# Patient Record
Sex: Female | Born: 1950 | Race: White | Hispanic: No | Marital: Married | Smoking: Former smoker
Health system: Southern US, Community
[De-identification: ages and names within clinical notes are randomized; demographics above are authoritative.]

## PROBLEM LIST (undated history)

## (undated) DIAGNOSIS — F329 Major depressive disorder, single episode, unspecified: Secondary | ICD-10-CM

## (undated) DIAGNOSIS — J189 Pneumonia, unspecified organism: Secondary | ICD-10-CM

## (undated) DIAGNOSIS — E559 Vitamin D deficiency, unspecified: Secondary | ICD-10-CM

## (undated) DIAGNOSIS — E079 Disorder of thyroid, unspecified: Secondary | ICD-10-CM

## (undated) DIAGNOSIS — M1990 Unspecified osteoarthritis, unspecified site: Secondary | ICD-10-CM

## (undated) DIAGNOSIS — S46212A Strain of muscle, fascia and tendon of other parts of biceps, left arm, initial encounter: Secondary | ICD-10-CM

## (undated) DIAGNOSIS — J302 Other seasonal allergic rhinitis: Secondary | ICD-10-CM

## (undated) DIAGNOSIS — R7303 Prediabetes: Secondary | ICD-10-CM

## (undated) DIAGNOSIS — K5790 Diverticulosis of intestine, part unspecified, without perforation or abscess without bleeding: Secondary | ICD-10-CM

## (undated) DIAGNOSIS — E785 Hyperlipidemia, unspecified: Secondary | ICD-10-CM

## (undated) DIAGNOSIS — F32A Depression, unspecified: Secondary | ICD-10-CM

## (undated) DIAGNOSIS — F419 Anxiety disorder, unspecified: Secondary | ICD-10-CM

## (undated) DIAGNOSIS — M810 Age-related osteoporosis without current pathological fracture: Secondary | ICD-10-CM

## (undated) DIAGNOSIS — Z860100 Personal history of colon polyps, unspecified: Secondary | ICD-10-CM

## (undated) DIAGNOSIS — E039 Hypothyroidism, unspecified: Secondary | ICD-10-CM

## (undated) DIAGNOSIS — K219 Gastro-esophageal reflux disease without esophagitis: Secondary | ICD-10-CM

## (undated) DIAGNOSIS — R0600 Dyspnea, unspecified: Secondary | ICD-10-CM

## (undated) DIAGNOSIS — H269 Unspecified cataract: Secondary | ICD-10-CM

## (undated) DIAGNOSIS — E271 Primary adrenocortical insufficiency: Secondary | ICD-10-CM

## (undated) HISTORY — DX: Major depressive disorder, single episode, unspecified: F32.9

## (undated) HISTORY — DX: Unspecified osteoarthritis, unspecified site: M19.90

## (undated) HISTORY — DX: Depression, unspecified: F32.A

## (undated) HISTORY — DX: Gastro-esophageal reflux disease without esophagitis: K21.9

## (undated) HISTORY — PX: UPPER GI ENDOSCOPY: SHX6162

## (undated) HISTORY — DX: Disorder of thyroid, unspecified: E07.9

## (undated) HISTORY — DX: Anxiety disorder, unspecified: F41.9

## (undated) HISTORY — DX: Hyperlipidemia, unspecified: E78.5

## (undated) HISTORY — DX: Age-related osteoporosis without current pathological fracture: M81.0

---
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
## 2007-05-08 HISTORY — PX: OTHER SURGICAL HISTORY: SHX169

## 2012-05-07 HISTORY — PX: COLONOSCOPY: SHX174

## 2014-05-20 ENCOUNTER — Other Ambulatory Visit: Payer: Self-pay | Admitting: Specialist

## 2014-05-20 DIAGNOSIS — M542 Cervicalgia: Secondary | ICD-10-CM

## 2014-05-28 ENCOUNTER — Other Ambulatory Visit: Payer: Self-pay

## 2016-03-07 DIAGNOSIS — E271 Primary adrenocortical insufficiency: Secondary | ICD-10-CM | POA: Insufficient documentation

## 2016-03-07 DIAGNOSIS — E559 Vitamin D deficiency, unspecified: Secondary | ICD-10-CM | POA: Insufficient documentation

## 2016-03-07 DIAGNOSIS — E039 Hypothyroidism, unspecified: Secondary | ICD-10-CM | POA: Insufficient documentation

## 2017-01-17 DIAGNOSIS — G8929 Other chronic pain: Secondary | ICD-10-CM | POA: Insufficient documentation

## 2017-01-17 DIAGNOSIS — M7061 Trochanteric bursitis, right hip: Secondary | ICD-10-CM | POA: Insufficient documentation

## 2017-01-17 DIAGNOSIS — M25562 Pain in left knee: Secondary | ICD-10-CM

## 2017-05-07 DIAGNOSIS — K5790 Diverticulosis of intestine, part unspecified, without perforation or abscess without bleeding: Secondary | ICD-10-CM

## 2017-05-07 HISTORY — DX: Diverticulosis of intestine, part unspecified, without perforation or abscess without bleeding: K57.90

## 2017-07-10 ENCOUNTER — Encounter: Payer: Self-pay | Admitting: Gastroenterology

## 2017-07-11 ENCOUNTER — Encounter: Payer: Self-pay | Admitting: Gastroenterology

## 2017-08-20 ENCOUNTER — Ambulatory Visit (INDEPENDENT_AMBULATORY_CARE_PROVIDER_SITE_OTHER): Payer: Medicare Other | Admitting: Gastroenterology

## 2017-08-20 ENCOUNTER — Encounter: Payer: Self-pay | Admitting: Gastroenterology

## 2017-08-20 VITALS — BP 120/80 | HR 80 | Ht 59.0 in | Wt 137.4 lb

## 2017-08-20 DIAGNOSIS — D369 Benign neoplasm, unspecified site: Secondary | ICD-10-CM | POA: Diagnosis not present

## 2017-08-20 DIAGNOSIS — K219 Gastro-esophageal reflux disease without esophagitis: Secondary | ICD-10-CM | POA: Diagnosis not present

## 2017-08-20 DIAGNOSIS — R1310 Dysphagia, unspecified: Secondary | ICD-10-CM

## 2017-08-20 MED ORDER — SOD PICOSULFATE-MAG OX-CIT ACD 10-3.5-12 MG-GM -GM/160ML PO SOLN: 1.0000 | Freq: Once | ORAL | 0 refills | Status: AC

## 2017-08-20 NOTE — Patient Instructions (Signed)
If you are age 66 or older, your body mass index should be between 23-30. Your Body mass index is 27.75 kg/m. If this is out of the aforementioned range listed, please consider follow up with your Primary Care Provider.  If you are age 52 or younger, your body mass index should be between 19-25. Your Body mass index is 27.75 kg/m. If this is out of the aformentioned range listed, please consider follow up with your Primary Care Provider.   You have been scheduled for a colonoscopy. Please follow written instructions given to you at your visit today.  Please pick up your prep supplies at the pharmacy within the next 1-3 days. If you use inhalers (even only as needed), please bring them with you on the day of your procedure. Your physician has requested that you go to www.startemmi.com and enter the access code given to you at your visit today. This web site gives a general overview about your procedure. However, you should still follow specific instructions given to you by our office regarding your preparation for the procedure.  You have been scheduled for an endoscopy. Please follow written instructions given to you at your visit today. If you use inhalers (even only as needed), please bring them with you on the day of your procedure. Your physician has requested that you go to www.startemmi.com and enter the access code given to you at your visit today. This web site gives a general overview about your procedure. However, you should still follow specific instructions given to you by our office regarding your preparation for the procedure.  Take baby aspirin one by mouth daily.   Thank you,  Dr. Jackquline Denmark

## 2017-08-20 NOTE — Progress Notes (Signed)
Chief Complaint: Dysphagia and history of polyps  Referring Provider: Lucita Lora       ASSESSMENT AND PLAN;  1. Esophageal Dysphagia - Differential diagnoses includes esophageal stricture, Schatzki's ring, motility disorder, eosinophilic esophagitis, pill induced esophagitis, rule out esophageal carcinoma or extrinsic lesions.  2. GERD -patient on steroids as well for Addison's. 3. H/O colonic polyps 2014. 4. RLQ chronic pain (likely musculoskeletal)  Plan: - Start omeprazole 20mg  po qd, continue Zantac 150mg  po QHS x 2 weeks, then continue omeprazole. - EGD with possible dilatation and colonoscopy (for FU of tubular adenomas).  I have discussed the risks and benefits.  The risks including risk of perforation requiring laparotomy, bleeding after polypectomy requiring blood transfusions and risks of anesthesia/sedation were discussed.  Rare risks of missing UGI/colorectal neoplasms.  Alternatives were given.  Patient is fully aware and agrees to proceed. All the questions were answered. EGD and Colonoscopy will be scheduled in upcoming days.  Patient is to report immediately if there is any significant weight loss or excessive bleeding until then. Consent forms were given for review. -I have instructed patient that she needs to chew food especially meats and breads well and eat slowly. - CT abdomen/pelvis, if still with abdominal pain.      HPI:   67 year old very pleasant white female with history of anxiety/depression, hypothyroidism, Addison's disease, hypercholesterolemia, arthritis with occasional problems with dysphagia mostly to solids and 2 pills.  She has long-standing history of heartburn and had been on omeprazole.  She had stopped omeprazole several months ago due to potential side effects.  She was switched to Zantac.  She continues to have problems with heartburn occasionally.  Zantac has been doubled.  She has been having occasional dysphagia described in the mid chest,  mostly to solids, no melena or hematochezia, no weight loss.  It occurs only intermittently and is not getting any worse over the last few months.  No nausea, vomiting, regurgitation, odynophagia.  No significant diarrhea or constipation.  There is no melena or hematochezia.  She would have occasional right lower quadrant abdominal pain associated with back pain.  This pain is not related to defecation.  This would get worse if she lifts heavy objects.  PMH: Anxiety/depression. Hypothyroidism Addison's disease Hypercholesterolemia History of pneumonia Gastroesophageal reflux. Arthritis H/O colonic polyps (TA) 01/27/2103  Past Surgical History:  Procedure Laterality Date  . colon polyps  2009  . COLONOSCOPY  2009   Dr. Lyndel Safe   Family History  Problem Relation Age of Onset  . Peptic Ulcer Mother   . Heart attack Father   . Stroke Father   . Peptic Ulcer Father        lost 6 pints of blood  . Colon cancer Maternal Aunt   . 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 Smoking status: Never Smoker  . Smokeless tobacco: Never Used  Substance Use Topics  . Alcohol use: Never    Frequency: Never  . Drug use: Never   Current Outpatient Medications  Medication Sig Dispense Refill  . co-enzyme Q-10 50 MG capsule Take 100 mg by mouth daily.    . DOCOSAHEXAENOIC ACID PO Take 1 g by mouth daily.    . hydrocortisone (CORTEF) 10 MG tablet Take 10 mg by mouth daily.  1  . levothyroxine (SYNTHROID, LEVOTHROID) 88 MCG tablet Take 88 mcg by mouth daily.    Marland Kitchen loratadine (CLARITIN) 10  MG tablet Take 10 mg by mouth as needed.    . Multiple Vitamins-Minerals (CENTRUM WOMEN PO) Take by mouth.    . ranitidine (ZANTAC) 150 MG capsule Take 150 mg by mouth daily.    . rosuvastatin (CRESTOR) 10 MG tablet Take 10 mg by mouth daily.    Marland Kitchen venlafaxine XR (EFFEXOR-XR) 75 MG 24 hr capsule Take 75 mg by mouth daily.     No current  facility-administered medications for this visit.    Not on File  Review of Systems:  Constitutional: Denies fever, chills, diaphoresis, appetite change and fatigue.  HEENT: Denies photophobia, eye pain, redness, hearing loss, ear pain, congestion, sore throat, rhinorrhea, sneezing, mouth sores, neck pain, neck stiffness and tinnitus.   Respiratory: Denies SOB, DOE, cough, chest tightness,  and wheezing.   Cardiovascular: Denies chest pain, palpitations and leg swelling.  Genitourinary: Denies dysuria, urgency, frequency, hematuria, flank pain and difficulty urinating.  Musculoskeletal: Denies myalgias, back pain, joint swelling, arthralgias and gait problem.  Skin: No rash. .  Neurological: Denies dizziness, seizures, syncope, weakness, light-headedness, numbness and headaches.  Hematological: Denies adenopathy. Easy bruising, personal or family bleeding history  Psychiatric/Behavioral: No anxiety or depression     Physical Exam:    BP 120/80   Pulse 80   Ht 4\' 11"  (1.499 m)   Wt 137 lb 6 oz (62.3 kg)   BMI 27.75 kg/m  Constitutional:  Well-developed, in no acute distress. Psychiatric: Normal mood and affect. Behavior is normal. HEENT: Pupils normal.  Conjunctivae are normal. No scleral icterus. Neck supple.  Cardiovascular: Normal rate, regular rhythm. No edema Pulmonary/chest: Effort normal and breath sounds normal. No wheezing, rales or rhonchi. Abdominal: Soft, nondistended. Nontender. Bowel sounds active throughout. There are no masses palpable. No hepatomegaly. Rectal:    Neurological: Alert and oriented to person place and time. Skin: Skin is warm and dry. No rashes noted.  No flowsheet data found.    Jackquline Denmark, MD   Cc Lucita Lora FNP

## 2017-09-03 ENCOUNTER — Telehealth: Payer: Self-pay

## 2017-09-03 NOTE — Telephone Encounter (Signed)
Phone call from patient. States her appointment has been moved up from 1:30 to 11:00am. When should she start her prep?Marland Kitchen Instructed patient to start prep at 5:00 am instead of 7:00am. Day of procedure.

## 2017-09-04 ENCOUNTER — Other Ambulatory Visit: Payer: Self-pay

## 2017-09-04 ENCOUNTER — Ambulatory Visit (INDEPENDENT_AMBULATORY_CARE_PROVIDER_SITE_OTHER): Payer: Medicare Other | Admitting: Gastroenterology

## 2017-09-04 ENCOUNTER — Encounter: Payer: Self-pay | Admitting: Gastroenterology

## 2017-09-04 VITALS — BP 137/91 | HR 70 | Temp 99.0°F | Resp 12 | Ht 59.0 in | Wt 137.0 lb

## 2017-09-04 DIAGNOSIS — K219 Gastro-esophageal reflux disease without esophagitis: Secondary | ICD-10-CM

## 2017-09-04 DIAGNOSIS — Z86010 Personal history of colonic polyps: Secondary | ICD-10-CM

## 2017-09-04 DIAGNOSIS — D120 Benign neoplasm of cecum: Secondary | ICD-10-CM | POA: Diagnosis not present

## 2017-09-04 DIAGNOSIS — K635 Polyp of colon: Secondary | ICD-10-CM

## 2017-09-04 DIAGNOSIS — K529 Noninfective gastroenteritis and colitis, unspecified: Secondary | ICD-10-CM | POA: Diagnosis not present

## 2017-09-04 DIAGNOSIS — D125 Benign neoplasm of sigmoid colon: Secondary | ICD-10-CM

## 2017-09-04 DIAGNOSIS — R1319 Other dysphagia: Secondary | ICD-10-CM

## 2017-09-04 DIAGNOSIS — R1310 Dysphagia, unspecified: Secondary | ICD-10-CM | POA: Diagnosis not present

## 2017-09-04 DIAGNOSIS — D369 Benign neoplasm, unspecified site: Secondary | ICD-10-CM

## 2017-09-04 MED ORDER — SODIUM CHLORIDE 0.9 % IV SOLN: 500.0000 mL | Freq: Once | INTRAVENOUS | Status: DC

## 2017-09-04 NOTE — Progress Notes (Signed)
A and O x3. Report to RN. Tolerated MAC anesthesia well.

## 2017-09-04 NOTE — Progress Notes (Signed)
Called to room to assist during endoscopic procedure.  Patient ID and intended procedure confirmed with present staff. Received instructions for my participation in the procedure from the performing physician.  

## 2017-09-04 NOTE — Progress Notes (Signed)
Teeth unchanged after procedure. 

## 2017-09-04 NOTE — Patient Instructions (Signed)
YOU HAD AN ENDOSCOPIC PROCEDURE TODAY AT THE [facility]:   Refer to the procedure report that was given to you for any specific questions about what was found during the examination.  If the procedure report does not answer your questions, please call your gastroenterologist to clarify.  If you requested that your care partner not be given the details of your procedure findings, then the procedure report has been included in a sealed envelope for you to review at your convenience later.  YOU SHOULD EXPECT: Some feelings of bloating in the abdomen. Passage of more gas than usual.  Walking can help get rid of the air that was put into your GI tract during the procedure and reduce the bloating. If you had a lower endoscopy (such as a colonoscopy or flexible sigmoidoscopy) you may notice spotting of blood in your stool or on the toilet paper. If you underwent a bowel prep for your procedure, you may not have a normal bowel movement for a few days.  Please Note:  You might notice some irritation and congestion in your nose or some drainage.  This is from the oxygen used during your procedure.  There is no need for concern and it should clear up in a day or so.  SYMPTOMS TO REPORT IMMEDIATELY:   Following lower endoscopy (colonoscopy or flexible sigmoidoscopy):  Excessive amounts of blood in the stool  Significant tenderness or worsening of abdominal pains  Swelling of the abdomen that is new, acute  Fever of 100F or higher  Please see handouts on polyps and diverticulosis.  For urgent or emergent issues, a gastroenterologist can be reached at any hour by calling [phone].   DIET:  We do recommend a small meal at first, but then you may proceed to your regular diet.  Drink plenty of fluids but you should avoid alcoholic beverages for 24 hours.  ACTIVITY:  You should plan to take it easy for the rest of today and you should NOT DRIVE or use heavy machinery until tomorrow (because  of the sedation medicines used during the test).    FOLLOW UP: Our staff will call the number listed on your records the next business day following your procedure to check on you and address any questions or concerns that you may have regarding the information given to you following your procedure. If we do not reach you, we will leave a message.  However, if you are feeling well and you are not experiencing any problems, there is no need to return our call.  We will assume that you have returned to your regular daily activities without incident.  If any biopsies were taken you will be contacted by phone or by letter within the next 1-3 weeks.  Please call us at [phone] if you have not heard about the biopsies in 3 weeks.    SIGNATURES/CONFIDENTIALITY: You and/or your care partner have signed paperwork which will be entered into your electronic medical record.  These signatures attest to the fact that that the information above on your After Visit Summary has been reviewed and is understood.  Full responsibility of the confidentiality of this discharge information lies with you and/or your care-partner.   Thank you for allowing us to provide your healthcare today.  

## 2017-09-04 NOTE — Op Note (Signed)
[place] Patient Name: Krystal Swanson Procedure Date: 09/04/2017 11:02 AM MRN: [id number] Endoscopist: Jackquline Denmark MD, MD Age: 67 Referring MD:  Date of Birth: [birth year] Gender: Female Account #: [id number] Procedure:                Colonoscopy Indications:              High risk colon cancer surveillance: Personal                            history of colonic polyps Medicines:                Monitored Anesthesia Care Procedure:                Pre-Anesthesia Assessment:                           - Prior to the procedure, a History and Physical                            was performed, and patient medications and                            allergies were reviewed. The risks and benefits of                            the procedure and the sedation options and risks                            were discussed with the patient. All questions were                            answered and informed consent was obtained. Patient                            identification and proposed procedure were verified                            by the physician in the procedure room. Mental                            Status Examination: alert and oriented.                            Prophylactic Antibiotics: The patient does not                            require prophylactic antibiotics. Prior                            Anticoagulants: The patient has taken no previous                            anticoagulant or antiplatelet agents. ASA Grade  Assessment: II - A patient with mild systemic                            disease. After reviewing the risks and benefits,                            the patient was deemed in satisfactory condition to                            undergo the procedure. The anesthesia plan was to                            use monitored anesthesia care (MAC). Immediately                            prior to administration of medications, the  patient                            was re-assessed for adequacy to receive sedatives.                            The heart rate, respiratory rate, oxygen                            saturations, blood pressure, adequacy of pulmonary                            ventilation, and response to care were monitored                            throughout the procedure. The physical status of                            the patient was re-assessed after the procedure.                           After obtaining informed consent, the colonoscope                            was passed under direct vision. Throughout the                            procedure, the patient's blood pressure, pulse, and                            oxygen saturations were monitored continuously. The                            Model PCF-H190DL [id number]) scope was introduced                            through the anus and advanced to the the cecum,  identified by appendiceal orifice and ileocecal                            valve. The colonoscopy was performed without                            difficulty. The patient tolerated the procedure                            well. The quality of the bowel preparation was                            adequate to identify polyps 6 mm and larger in size. Scope In: 11:51:33 AM Scope Out: 12:10:29 PM Scope Withdrawal Time: 0 hours 13 minutes 36 seconds  Total Procedure Duration: 0 hours 18 minutes 56 seconds  Findings:                 A 4 mm polyp was found in the cecum. The polyp was                            sessile. The polyp was removed with a cold biopsy                            forceps. Resection and retrieval were complete.                           A 8 mm polyp was found in the sigmoid colon. The                            polyp was sessile. The polyp was removed with a hot                            snare. Resection and retrieval were complete.                            A localized area of mildly erythematous mucosa with                            superficial erosions, measuring approximately 1 cm,                            a fold above the ileocecal valvewas found in the                            proximal ascending colon. Biopsies were taken with                            a cold forceps for histology.                           A few diverticula were found in the sigmoid colon.  A small nonbleeding 6 mm AVM was noted in the                            cecum. Not treated as there was no active bleeding.                           Internal hemorrhoids were found during                            retroflexion. The hemorrhoids were small. Complications:            No immediate complications. Estimated Blood Loss:     Estimated blood loss: none. Impression:               - Colonic polyps status post polypectomy.                           - Erythematous mucosa in the proximal ascending                            colon - likely localized ischemia, doubt                            neoplastic. Biopsied.                           - Diverticulosis in the sigmoid colon.                           - Small nonbleeding AVM in the cecum                           - Internal hemorrhoids. Recommendation:           - Patient has a contact number available for                            emergencies. The signs and symptoms of potential                            delayed complications were discussed with the                            patient. Return to normal activities tomorrow.                            Written discharge instructions were provided to the                            patient.                           - Resume previous diet.                           - Continue present medications.                           -  Await pathology results.                           - Repeat colonoscopy date to be determined after                             pending pathology results are reviewed for                            surveillance based on pathology results. Jackquline Denmark MD, MD 09/04/2017 12:26:23 PM This report has been signed electronically.

## 2017-09-04 NOTE — Op Note (Signed)
[place] Patient Name: Krystal Swanson Procedure Date: 09/04/2017 11:03 AM MRN: [id number] Endoscopist: Jackquline Denmark MD, MD Age: 67 Referring MD:  Date of Birth: 1950/06/20 Gender: Female Account #: [id number] Procedure:                Upper GI endoscopy Indications:              Dysphagia Medicines:                Monitored Anesthesia Care Procedure:                Pre-Anesthesia Assessment:                           - Prior to the procedure, a History and Physical                            was performed, and patient medications and                            allergies were reviewed. The patient is competent.                            The risks and benefits of the procedure and the                            sedation options and risks were discussed with the                            patient. All questions were answered and informed                            consent was obtained. Patient identification and                            proposed procedure were verified by the physician                            in the procedure room. Mental Status Examination:                            alert and oriented. Prophylactic Antibiotics: The                            patient does not require prophylactic antibiotics.                            Prior Anticoagulants: The patient has taken no                            previous anticoagulant or antiplatelet agents. ASA                            Grade Assessment: II - A patient with mild systemic  disease. After reviewing the risks and benefits,                            the patient was deemed in satisfactory condition to                            undergo the procedure. The anesthesia plan was to                            use monitored anesthesia care (MAC). Immediately                            prior to administration of medications, the patient                            was re-assessed for  adequacy to receive sedatives.                            The heart rate, respiratory rate, oxygen                            saturations, blood pressure, adequacy of pulmonary                            ventilation, and response to care were monitored                            throughout the procedure. The physical status of                            the patient was re-assessed after the procedure.                           After obtaining informed consent, the endoscope was                            passed under direct vision. Throughout the                            procedure, the patient's blood pressure, pulse, and                            oxygen saturations were monitored continuously. The                            Model GIF-[id number]) scope was introduced                            through the mouth, and advanced to the second part                            of duodenum. The upper GI endoscopy was  accomplished without difficulty. The patient                            tolerated the procedure well. Scope In: Scope Out: Findings:                 The examined esophagus was normal. Biopsies were                            obtained from the proximal and mid esophagus with                            cold forceps for histology to r/o eosinophilic                            esophagitis.                           Localized mild inflammation characterized by                            erythema was found in the gastric antrum. Biopsies                            were taken with a cold forceps for Helicobacter                            pylori testing using CLOtest.                           The exam was otherwise without abnormality. Since                            there were no obvious lesions in the esophagus, we                            decided to hold off on empiric esophageal                            dilatation. Complications:            No  immediate complications. Estimated Blood Loss:     Estimated blood loss: none. Impression:               - Mild gastritis.                           - The examination was otherwise normal. Recommendation:           - Patient has a contact number available for                            emergencies. The signs and symptoms of potential                            delayed complications were discussed with the  patient. Return to normal activities tomorrow.                            Written discharge instructions were provided to the                            patient.                           - Resume previous diet.                           - Continue present medications. continue omeprazole                            for now.                           - Await pathology results.                           - Return to my office in 12 weeks. Jackquline Denmark MD, MD 09/04/2017 12:16:50 PM This report has been signed electronically.

## 2017-09-05 ENCOUNTER — Telehealth: Payer: Self-pay

## 2017-09-05 NOTE — Telephone Encounter (Signed)
  Follow up Call-  Call back number 09/04/2017  Post procedure Call Back phone  # [phone]  Permission to leave phone message Yes  Some recent data might be hidden     Patient questions:  Do you have a fever, pain , or abdominal swelling? No. Pain Score  0 *  Have you tolerated food without any problems? Yes.    Have you been able to return to your normal activities? Yes.    Do you have any questions about your discharge instructions: Diet   No. Medications  No. Follow up visit  No.  Do you have questions or concerns about your Care? No.  Actions: * If pain score is 4 or above: No action needed, pain <4.  No problems noted per pt. maw

## 2017-09-05 NOTE — Telephone Encounter (Signed)
Per the procedure note, she was scheduled for a 12 week follow-up on 11/29/17 at 11:00 am in [facility].  A letter was mailed to her.

## 2017-09-06 LAB — HELICOBACTER PYLORI SCREEN-BIOPSY: UREASE: NEGATIVE

## 2017-09-09 NOTE — Telephone Encounter (Signed)
[email or web address]

## 2017-09-15 ENCOUNTER — Encounter: Payer: Self-pay | Admitting: Gastroenterology

## 2017-11-29 ENCOUNTER — Ambulatory Visit: Payer: Medicare Other | Admitting: Gastroenterology

## 2017-12-06 ENCOUNTER — Encounter: Payer: Self-pay | Admitting: Gastroenterology

## 2017-12-17 ENCOUNTER — Ambulatory Visit (INDEPENDENT_AMBULATORY_CARE_PROVIDER_SITE_OTHER): Payer: Medicare Other | Admitting: Gastroenterology

## 2017-12-17 ENCOUNTER — Encounter: Payer: Self-pay | Admitting: Gastroenterology

## 2017-12-17 VITALS — BP 138/82 | HR 66 | Ht 59.0 in | Wt 138.8 lb

## 2017-12-17 DIAGNOSIS — Z86010 Personal history of colonic polyps: Secondary | ICD-10-CM

## 2017-12-17 DIAGNOSIS — K219 Gastro-esophageal reflux disease without esophagitis: Secondary | ICD-10-CM | POA: Diagnosis not present

## 2017-12-17 NOTE — Patient Instructions (Signed)
If you are age 66 or older, your body mass index should be between 23-30. Your Body mass index is 28.03 kg/m. If this is out of the aforementioned range listed, please consider follow up with your Primary Care Provider.  If you are age 55 or younger, your body mass index should be between 19-25. Your Body mass index is 28.03 kg/m. If this is out of the aformentioned range listed, please consider follow up with your Primary Care Provider.   Thank you, Dr. Lyndel Safe

## 2017-12-17 NOTE — Progress Notes (Signed)
IMPRESSION and PLAN:    #1. GERD -Decrease omeprazole 20mg  to PO QOD x 4 weeks, then can try stoping. -Zantac 150mg  po qhs to continue.     #2.  History of colonic polyps 09/2017 (Next colonoscopy due in 5 years).      HPI:    Chief Complaint:   Krystal Swanson is a 67 y.o. female  Follow-up from the procedures. She underwent EGD and colonoscopy on 09/2017.  EGD showed mild gastritis.  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 She has done very well. Has been complaining of frequent stool, has to wipe several times.  Mostly since the procedure.  However, she has also has started taking colon-cleanse. She does not want to stop it as she has felt significantly better.  She will call us with the ingredients however.  It is locally made in [place].  I have tried to google it but have not been able to google that particular variety of colon clense.   Past Medical History:  Diagnosis Date  . Anxiety   . Arthritis   . Depression   . GERD (gastroesophageal reflux disease)   . Hyperlipidemia   . Osteoporosis   . Thyroid disease     Current Outpatient Medications  Medication Sig Dispense Refill  . Cholecalciferol (VITAMIN D) 2000 units CAPS Take by mouth.    . co-enzyme Q-10 50 MG capsule Take 100 mg by mouth daily.    . DOCOSAHEXAENOIC ACID PO Take 1 g by mouth daily.    . hydrocortisone (CORTEF) 10 MG tablet Take 10 mg by mouth daily. Take 2 tablets in the AM and 1 1/2 in the afternoon  1  . levothyroxine (SYNTHROID, LEVOTHROID) 88 MCG tablet Take 88 mcg by mouth daily.    Marland Kitchen loratadine (CLARITIN) 10 MG tablet Take 10 mg by mouth as needed.    . Multiple Vitamins-Minerals (CENTRUM WOMEN PO) Take by mouth.    Marland Kitchen omeprazole (PRILOSEC) 20 MG capsule Take 20 mg by mouth daily.    . ranitidine (ZANTAC) 150 MG capsule Take 150 mg by mouth every evening.     . rosuvastatin (CRESTOR) 10 MG tablet Take 10 mg by mouth daily.    Marland Kitchen  venlafaxine XR (EFFEXOR-XR) 75 MG 24 hr capsule Take 75 mg by mouth daily.     Current Facility-Administered Medications  Medication Dose Route Frequency Provider Last Rate Last Dose  . 0.9 %  sodium chloride infusion  500 mL Intravenous Once Jackquline Denmark, MD        Past Surgical History:  Procedure Laterality Date  . colon polyps  2009  . COLONOSCOPY  2014   Colonic polyp. Mild sigmoid diverticulosis.     Family History  Problem Relation Age of Onset  . Peptic Ulcer Mother   . Heart attack Father   . Stroke Father   . Peptic Ulcer Father        lost 6 pints of blood  . Colon cancer Maternal Aunt   . Breast cancer Maternal Aunt     Social History   Tobacco Use  . Smoking status: Never Smoker  . Smokeless tobacco: Never Used  Substance Use Topics  . Alcohol use: Yes    Frequency: Never    Comment: social  . Drug use: Never    No Known Allergies   Review of Systems: All systems reviewed and negative except where noted in HPI.    Physical Exam:  BP 138/82   Pulse 66   Ht 4\' 11"  (1.499 m)   Wt 138 lb 12.8 oz (63 kg)   SpO2 98%   BMI 28.03 kg/m  @WEIGHTLAST3 @ GENERAL:  Alert, oriented, cooperative, not in acute distress. PSYCH: :Pleasant, normal mood and affect. PULM: Normal respiratory effort, lungs CTA bilaterally, no wheezing. ABDOMEN: Inspection: No visible peristalsis, no abnormal pulsations, skin normal.  Palpation/percussion: Soft, nontender, nondistended, no rigidity, no abnormal dullness to percussion, no hepatosplenomegaly and no palpable abdominal masses.  Auscultation: Normal bowel sounds, no abdominal bruits.     Krystal Feuerstein,MD 12/17/2017, 2:34 PM   CC Charlynn Court, NP

## 2018-04-21 ENCOUNTER — Ambulatory Visit: Payer: Self-pay | Admitting: Podiatry

## 2018-05-08 ENCOUNTER — Encounter: Payer: Self-pay | Admitting: Sports Medicine

## 2018-05-08 ENCOUNTER — Ambulatory Visit (INDEPENDENT_AMBULATORY_CARE_PROVIDER_SITE_OTHER): Payer: Medicare Other

## 2018-05-08 ENCOUNTER — Ambulatory Visit: Payer: Medicare Other | Admitting: Sports Medicine

## 2018-05-08 VITALS — BP 130/80 | HR 85 | Resp 16 | Ht 59.0 in | Wt 136.0 lb

## 2018-05-08 DIAGNOSIS — M79672 Pain in left foot: Secondary | ICD-10-CM

## 2018-05-08 DIAGNOSIS — M779 Enthesopathy, unspecified: Secondary | ICD-10-CM | POA: Diagnosis not present

## 2018-05-08 DIAGNOSIS — M2040 Other hammer toe(s) (acquired), unspecified foot: Secondary | ICD-10-CM | POA: Diagnosis not present

## 2018-05-08 DIAGNOSIS — M2042 Other hammer toe(s) (acquired), left foot: Secondary | ICD-10-CM

## 2018-05-08 DIAGNOSIS — D3610 Benign neoplasm of peripheral nerves and autonomic nervous system, unspecified: Secondary | ICD-10-CM | POA: Diagnosis not present

## 2018-05-08 DIAGNOSIS — M7752 Other enthesopathy of left foot: Secondary | ICD-10-CM | POA: Diagnosis not present

## 2018-05-08 NOTE — Progress Notes (Addendum)
Subjective: Krystal Swanson is a 68 y.o. female patient who presents to office for evaluation of left foot pain. Patient complains of progressive pain especially over the last 8 months at the ball of her left foot it feels like something is in there. Ranks pain 3/10 and is now interferring with daily activities. Patient has tried Aspercreme with no relief in symptoms. Patient denies any other pedal complaints. Denies injury/trip/fall/sprain/any causative factors.  Patient admits to multiple joint problems including low back right hip and left knee.  Patient is to see orthopedic doctor today.  Review of Systems  Musculoskeletal: Positive for joint pain.  All other systems reviewed and are negative.   Patient Active Problem List   Diagnosis Date Noted  . Chronic pain of left knee 01/17/2017  . Trochanteric bursitis of right hip 01/17/2017  . 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 Hypothyroid 03/07/2016  . Vitamin D deficiency 03/07/2016    Current Outpatient Medications on File Prior to Visit  Medication Sig Dispense Refill  . Cholecalciferol (VITAMIN D) 2000 units CAPS Take by mouth.    . DOCOSAHEXAENOIC ACID PO Take 1 g by mouth daily.    . hydrocortisone (CORTEF) 10 MG tablet Take 10 mg by mouth daily. Take 2 tablets in the AM and 1 1/2 in the afternoon  1  . levothyroxine (SYNTHROID, LEVOTHROID) 88 MCG tablet Take 88 mcg by mouth daily.    Marland Kitchen loratadine (CLARITIN) 10 MG tablet Take 10 mg by mouth as needed.    . Multiple Vitamins-Minerals (CENTRUM WOMEN PO) Take by mouth.    Marland Kitchen omeprazole (PRILOSEC) 20 MG capsule Take 20 mg by mouth daily.    . rosuvastatin (CRESTOR) 10 MG tablet Take 10 mg by mouth daily.    Marland Kitchen venlafaxine XR (EFFEXOR-XR) 75 MG 24 hr capsule Take 75 mg by mouth daily.     Current Facility-Administered Medications on File Prior to Visit  Medication Dose Route Frequency Provider Last Rate Last Dose  . 0.9 %  sodium chloride infusion  500 mL Intravenous Once Jackquline Denmark, MD        No Known Allergies  Objective:  General: Alert and oriented x3 in no acute distress  Dermatology: No open lesions bilateral lower extremities, no webspace macerations, no ecchymosis bilateral, all nails x 10 are well manicured.  Vascular: Dorsalis Pedis and Posterior Tibial pedal pulses palpable, Capillary Fill Time 3 seconds,(+) pedal hair growth bilateral, no edema bilateral lower extremities, Temperature gradient within normal limits.  Neurology: Gross sensation intact via light touch bilateral. (- )Tinels sign bilateral.  Subjective numbness to toes left greater than right.  Musculoskeletal: Mild tenderness with palpation at ball of left foot especially underneath the second metatarsal head,No pain with calf compression bilateral. There is decreased ankle rom with knee extending  vs flexed resembling gastroc equnius bilateral, Subtalar joint range of motion is within normal limits, there is no 1st ray hypermobility noted bilateral, decreased 1st MPJ rom Right>Left with functional limitus noted on weightbearing exam and second hammertoe deformity with medial deviation on left. Strength within normal limits in all groups bilateral.   Gait: Antalgic gait  Xrays  Left Foot   Impression: Normal osseous mineralization, there is mild hammertoe contracture with the most contracture noted at the second toe, decreased intermetatarsal space at the second and third metatarsals, inferior calcaneal spur, no other acute findings.  Assessment and Plan: Problem List Items Addressed This Visit    None    Visit Diagnoses  Left foot pain    -  Primary   Relevant Orders   DG Foot Complete Left   Capsulitis       Hammer toe, unspecified laterality       Neuroma           -Complete examination performed -Xrays reviewed -Discussed treatement options for likely capsulitis with hammertoe versus neuroma on left -Patient declines steroid injection at this time -Dispensed  metatarsal padding sleeve -Advised patient to follow-up with orthopedic doctor and if symptoms continue to progress return to office after 1 month for injection treatment -Patient to return to office as needed or sooner if condition worsens.  Landis Martins, DPM

## 2018-05-22 ENCOUNTER — Other Ambulatory Visit: Payer: Self-pay | Admitting: Sports Medicine

## 2018-05-22 DIAGNOSIS — M2040 Other hammer toe(s) (acquired), unspecified foot: Secondary | ICD-10-CM

## 2018-05-22 DIAGNOSIS — M79672 Pain in left foot: Secondary | ICD-10-CM

## 2018-05-22 DIAGNOSIS — M779 Enthesopathy, unspecified: Secondary | ICD-10-CM

## 2018-07-21 ENCOUNTER — Encounter (HOSPITAL_COMMUNITY): Payer: Self-pay | Admitting: Student

## 2018-07-21 NOTE — H&P (Signed)
TOTAL HIP ADMISSION H&P  Patient is admitted for right total hip arthroplasty.  Subjective:  Chief Complaint: right hip pain  HPI: Krystal Swanson, 68 y.o. female, has a history of pain and functional disability in the right hip(s) due to arthritis and patient has failed non-surgical conservative treatments for greater than 12 weeks to include activity modification.  Onset of symptoms was gradual starting 2 years ago with gradually worsening course since that time.The patient noted no past surgery on the right hip(s).  Patient currently rates pain in the right hip at 10 out of 10 with activity. Patient has worsening of pain with activity and weight bearing and pain that interfers with activities of daily living. Patient has evidence of bone-on-bone arthritis in the right hip with subchondral cystic formation and osteophyte formation. I feel that it is osteoarthritis and not avascular necrosis by imaging studies.There is no current active infection.  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 Mild sigmoid diverticulosis.     Current Facility-Administered Medications  Medication Dose Route Frequency Provider Last Rate Last Dose   0.9 %  sodium chloride infusion  500 mL Intravenous Once Jackquline Denmark, MD       Current Outpatient Medications  Medication Sig Dispense Refill Last Dose   Cholecalciferol (VITAMIN D) 2000 units CAPS Take by mouth.   Taking   DOCOSAHEXAENOIC ACID PO Take 1 g by  mouth daily.   Taking   hydrocortisone (CORTEF) 10 MG tablet Take 10 mg by mouth daily. Take 2 tablets in the AM and 1 1/2 in the afternoon  1 Taking   levothyroxine (SYNTHROID, LEVOTHROID) 88 MCG tablet Take 88 mcg by mouth daily.   Taking   loratadine (CLARITIN) 10 MG tablet Take 10 mg by mouth as needed.   Taking   Multiple Vitamins-Minerals (CENTRUM WOMEN PO) Take by mouth.   Taking   omeprazole (PRILOSEC) 20 MG capsule Take 20 mg by mouth daily.   Taking   rosuvastatin (CRESTOR) 10 MG tablet Take 10 mg by mouth daily.   Taking   venlafaxine XR (EFFEXOR-XR) 75 MG 24 hr capsule Take 75 mg by mouth daily.   Taking   No Known Allergies  Social History   Tobacco Use   Smoking status: Never Smoker   Smokeless tobacco: Never Used  Substance Use Topics   Alcohol use: Yes    Frequency: Never    Comment: social    Family History  Problem Relation Age of Onset   Peptic Ulcer Mother    Heart attack Father    Stroke Father    Peptic Ulcer Father        lost 6 pints of blood   Colon cancer Maternal Aunt    Breast cancer Maternal Aunt      ROS Constitutional: Constitutional: no fever, chills, night sweats, or significant weight loss.  Cardiovascular: Cardiovascular: no palpitations or chest pain.  Respiratory: Respiratory: no cough  or shortness of breath and No COPD.  Gastrointestinal: Gastrointestinal: no vomiting or nausea.  Musculoskeletal: Musculoskeletal: no swelling in Joints and Joint Pain.  Neurologic: Neurologic: no numbness, tingling, or difficulty with balance. Objective:  Physical Exam Patient is a 68 year old female. Well nourished and well developed. General: Alert and oriented x3, cooperative and pleasant, no acute distress. Head: normocephalic, atraumatic, neck supple. Eyes: EOMI. Respiratory: breath sounds clear in all fields, no wheezing, rales, or rhonchi. Cardiovascular: Regular rate and rhythm, no murmurs, gallops or rubs. Abdomen:  non-tender to palpation and soft, normoactive bowel sounds.  Musculoskeletal: Left Hip Exam: ROM: normal without discomfort. There is no tenderness over the greater trochanter bursa. There is no pain on provocative testing of the hip.  Right Hip Exam: ROM: Flexion to 110, No Internal Rotation, External Rotation 20, and abduction 20. She has pain on abduction and external rotation. There is no tenderness over the greater trochanter bursa. There is pain on provocative testing of the hip.  Calves soft and nontender. Motor function intact in LE. Strength 5/5 LE bilaterally.  Neuro: Distal pulses 2+. Sensation to light touch intact in LE.  Vital signs in last 24 hours:  Labs:   Estimated body mass index is 27.47 kg/m as calculated from the following:   Height as of 05/08/18: 4\' 11"  (1.499 m).   Weight as of 05/08/18: 61.7 kg.   Imaging Review Plain radiographs demonstrate severe degenerative joint disease of the right hip(s). The bone quality appears to be adequate for age and reported activity level.      Assessment/Plan:  End stage arthritis, right hip(s)  The patient history, physical examination, clinical judgement of the provider and imaging studies are consistent with end stage degenerative joint disease of the right hip(s) and total hip arthroplasty is deemed medically necessary. The treatment options including medical management, injection therapy, arthroscopy and arthroplasty were discussed at length. The risks and benefits of total hip arthroplasty were presented and reviewed. The risks due to aseptic loosening, infection, stiffness, dislocation/subluxation,  thromboembolic complications and other imponderables were discussed.  The patient acknowledged the explanation, agreed to proceed with the plan and consent was signed. Patient is being admitted for inpatient treatment for surgery, pain control, PT, OT, prophylactic antibiotics, VTE prophylaxis, progressive ambulation and  ADL's and discharge planning.The patient is planning to be discharged home.  Therapy Plans: HEP Disposition: Home with husband Planned DVT Prophylaxis: Xarelto 10mg  daily (family hx of clotting) DME needed: none PCP: Dr. Alveria Apley Endocrinologist: Dr. Tamala Julian TXA: IV Allergies: none Anesthesia Concerns: none BMI: 27.9  Last HgbA1c: 5.8% (prediabetic)  Other: Family hx of Factor 5 Leiden, patient has tested negative.   Endocrine Instructions per Dr. Tamala Julian: Hx of Addison's disease - takes hydrocortisone 20 mg qAM and 15 mg qPM at baseline.  The day prior to surgery, double morning and evening doses of oral hydrocortisone. Day of surgery, IV hydrocortisone 50 mg prior to surgery. Normal afternoon dose day of surgery. POD #1-4, take double normal morning and evening dose of oral hydrocortisone.   - Patient was instructed on what medications to stop prior to surgery. - Follow-up visit in 2 weeks with Dr. Wynelle Link - Begin physical therapy following surgery - Pre-operative lab work as pre-surgical testing - Prescriptions will be provided in hospital at time of discharge  Griffith Citron, PA-C Orthopedic Surgery [facility] [place]

## 2018-08-06 ENCOUNTER — Encounter (HOSPITAL_COMMUNITY): Admission: RE | Payer: Self-pay | Source: Home / Self Care

## 2018-08-06 ENCOUNTER — Inpatient Hospital Stay (HOSPITAL_COMMUNITY): Admission: RE | Admit: 2018-08-06 | Payer: Medicare Other | Source: Home / Self Care | Admitting: Orthopedic Surgery

## 2018-08-06 SURGERY — ARTHROPLASTY, HIP, TOTAL, ANTERIOR APPROACH
Anesthesia: Choice | Laterality: Right

## 2018-09-15 ENCOUNTER — Encounter (HOSPITAL_COMMUNITY): Payer: Self-pay

## 2018-09-15 NOTE — Patient Instructions (Addendum)
DUE TO COVID-19 NO VISITORS ARE ALLOWED IN THE HOSPITAL AT THIS TIME   COVID SWAB TESTING MUST BE COMPLETED ON: Thursday, Sep 18, 2018   Your procedure is scheduled on: Monday, Sep 22, 2018   Surgery Time: 10:30AM-12:10PM   Report to [place]  Entrance    Report to admitting at 8:00 AM   Call this number if you have problems the morning of surgery [phone]   Do not eat food:After Midnight.   May have liquids until 4:30AM day of surgery   CLEAR LIQUID DIET  Foods Allowed                                                                     Foods Excluded  Water, Black Coffee and tea, regular and decaf                             liquids that you cannot  Plain Jell-O in any flavor                                             see through such as: Fruit ices (not with fruit pulp)                                     milk, soups, orange juice  Iced Popsicles                                    All solid food Carbonated beverages, regular and diet                                    Cranberry, grape and apple juices Sports drinks like Gatorade Lightly seasoned clear broth or consume(fat free) Sugar, honey syrup  Sample Menu Breakfast                                Lunch                                     Supper Cranberry juice                    Beef broth                            Chicken broth Jell-O                                     Grape juice  Apple juice Coffee or tea                        Jell-O                                      Popsicle                                                Coffee or tea                        Coffee or tea   Complete one Ensure drink the morning of surgery at  4:30AM  the day of surgery.   Brush your teeth the morning of surgery.   Do NOT smoke after Midnight   Take these medicines the morning of surgery with A SIP OF WATER: Cortef, Levothyroxine, Omeprazole, Venlafaxine                                You may not have any metal on your body including hair pins, jewelry, and body piercings             Do not wear make-up, lotions, powders, perfumes/cologne, or deodorant             Do not wear nail polish.  Do not shave  48 hours prior to surgery.                Do not bring valuables to the hospital. [place].   Contacts, dentures or bridgework may not be worn into surgery.   Leave suitcase in the car. After surgery it may be brought to your room.    Special Instructions: Bring a copy of your healthcare power of attorney and living will documents         the day of surgery if you haven't scanned them in before.              Please read over the following fact sheets you were given:  [facility] [place] - Preparing for Surgery Before surgery, you can play an important role.  Because skin is not sterile, your skin needs to be as free of germs as possible.  You can reduce the number of germs on your skin by washing with CHG (chlorahexidine gluconate) soap before surgery.  CHG is an antiseptic cleaner which kills germs and bonds with the skin to continue killing germs even after washing. Please DO NOT use if you have an allergy to CHG or antibacterial soaps.  If your skin becomes reddened/irritated stop using the CHG and inform your nurse when you arrive at Short Stay. Do not shave (including legs and underarms) for at least 48 hours prior to the first CHG shower.  You may shave your face/neck.  Please follow these instructions carefully:  1.  Shower with CHG Soap the night before surgery and the  morning of surgery.  2.  If you choose to wash your hair, wash your hair first as usual with your normal  shampoo.  3.  After  you shampoo, rinse your hair and body thoroughly to remove the shampoo.                             4.  Use CHG as you would any other liquid soap.  You can apply chg directly to the skin and wash.  Gently with a scrungie or  clean washcloth.  5.  Apply the CHG Soap to your body ONLY FROM THE NECK DOWN.   Do   not use on face/ open                           Wound or open sores. Avoid contact with eyes, ears mouth and   genitals (private parts).                       Wash face,  Genitals (private parts) with your normal soap.             6.  Wash thoroughly, paying special attention to the area where your    surgery  will be performed.  7.  Thoroughly rinse your body with warm water from the neck down.  8.  DO NOT shower/wash with your normal soap after using and rinsing off the CHG Soap.                9.  Pat yourself dry with a clean towel.            10.  Wear clean pajamas.            11.  Place clean sheets on your bed the night of your first shower and do not  sleep with pets. Day of Surgery : Do not apply any lotions/deodorants the morning of surgery.  Please wear clean clothes to the hospital/surgery center.  FAILURE TO FOLLOW THESE INSTRUCTIONS MAY RESULT IN THE CANCELLATION OF YOUR SURGERY  PATIENT SIGNATURE_________________________________  NURSE SIGNATURE__________________________________  ________________________________________________________________________   Adam Phenix  An incentive spirometer is a tool that can help keep your lungs clear and active. This tool measures how well you are filling your lungs with each breath. Taking long deep breaths may help reverse or decrease the chance of developing breathing (pulmonary) problems (especially infection) following:  A long period of time when you are unable to move or be active. BEFORE THE PROCEDURE   If the spirometer includes an indicator to show your best effort, your nurse or respiratory therapist will set it to a desired goal.  If possible, sit up straight or lean slightly forward. Try not to slouch.  Hold the incentive spirometer in an upright position. INSTRUCTIONS FOR USE  1. Sit on the edge of your bed if possible, or  sit up as far as you can in bed or on a chair. 2. Hold the incentive spirometer in an upright position. 3. Breathe out normally. 4. Place the mouthpiece in your mouth and seal your lips tightly around it. 5. Breathe in slowly and as deeply as possible, raising the piston or the ball toward the top of the column. 6. Hold your breath for 3-5 seconds or for as long as possible. Allow the piston or ball to fall to the bottom of the column. 7. Remove the mouthpiece from your mouth and breathe out normally. 8. Rest for a few seconds and repeat Steps 1 through 7 at least 10 times every 1-2  hours when you are awake. Take your time and take a few normal breaths between deep breaths. 9. The spirometer may include an indicator to show your best effort. Use the indicator as a goal to work toward during each repetition. 10. After each set of 10 deep breaths, practice coughing to be sure your lungs are clear. If you have an incision (the cut made at the time of surgery), support your incision when coughing by placing a pillow or rolled up towels firmly against it. Once you are able to get out of bed, walk around indoors and cough well. You may stop using the incentive spirometer when instructed by your caregiver.  RISKS AND COMPLICATIONS  Take your time so you do not get dizzy or light-headed.  If you are in pain, you may need to take or ask for pain medication before doing incentive spirometry. It is harder to take a deep breath if you are having pain. AFTER USE  Rest and breathe slowly and easily.  It can be helpful to keep track of a log of your progress. Your caregiver can provide you with a simple table to help with this. If you are using the spirometer at home, follow these instructions: [place] IF:   You are having difficultly using the spirometer.  You have trouble using the spirometer as often as instructed.  Your pain medication is not giving enough relief while using the  spirometer.  You develop fever of 100.5 F (38.1 C) or higher. SEEK IMMEDIATE MEDICAL CARE IF:   You cough up bloody sputum that had not been present before.  You develop fever of 102 F (38.9 C) or greater.  You develop worsening pain at or near the incision site. MAKE SURE YOU:   Understand these instructions.  Will watch your condition.  Will get help right away if you are not doing well or get worse. Document Released: 09/03/2006 Document Revised: 07/16/2011 Document Reviewed: 11/04/2006 ExitCare Patient Information 2014 Exit[employer], [place].   ________________________________________________________________________  WHAT IS A BLOOD TRANSFUSION? Blood Transfusion Information  A transfusion is the replacement of blood or some of its parts. Blood is made up of multiple cells which provide different functions.  Red blood cells carry oxygen and are used for blood loss replacement.  White blood cells fight against infection.  Platelets control bleeding.  Plasma helps clot blood.  Other blood products are available for specialized needs, such as hemophilia or other clotting disorders. BEFORE THE TRANSFUSION  Who gives blood for transfusions?   Healthy volunteers who are fully evaluated to make sure their blood is safe. This is blood bank blood. Transfusion therapy is the safest it has ever been in the practice of medicine. Before blood is taken from a donor, a complete history is taken to make sure that person has no history of diseases nor engages in risky social behavior (examples are intravenous drug use or sexual activity with multiple partners). The donor's travel history is screened to minimize risk of transmitting infections, such as malaria. The donated blood is tested for signs of infectious diseases, such as HIV and hepatitis. The blood is then tested to be sure it is compatible with you in order to minimize the chance of a transfusion reaction. If you or a relative  donates blood, this is often done in anticipation of surgery and is not appropriate for emergency situations. It takes many days to process the donated blood. RISKS AND COMPLICATIONS Although transfusion therapy is very safe and  saves many lives, the main dangers of transfusion include:   Getting an infectious disease.  Developing a transfusion reaction. This is an allergic reaction to something in the blood you were given. Every precaution is taken to prevent this. The decision to have a blood transfusion has been considered carefully by your caregiver before blood is given. Blood is not given unless the benefits outweigh the risks. AFTER THE TRANSFUSION  Right after receiving a blood transfusion, you will usually feel much better and more energetic. This is especially true if your red blood cells have gotten low (anemic). The transfusion raises the level of the red blood cells which carry oxygen, and this usually causes an energy increase.  The nurse administering the transfusion will monitor you carefully for complications. HOME CARE INSTRUCTIONS  No special instructions are needed after a transfusion. You may find your energy is better. Speak with your caregiver about any limitations on activity for underlying diseases you may have. SEEK MEDICAL CARE IF:   Your condition is not improving after your transfusion.  You develop redness or irritation at the intravenous (IV) site. SEEK IMMEDIATE MEDICAL CARE IF:  Any of the following symptoms occur over the next 12 hours:  Shaking chills.  You have a temperature by mouth above 102 F (38.9 C), not controlled by medicine.  Chest, back, or muscle pain.  People around you feel you are not acting correctly or are confused.  Shortness of breath or difficulty breathing.  Dizziness and fainting.  You get a rash or develop hives.  You have a decrease in urine output.  Your urine turns a dark color or changes to pink, red, or brown. Any  of the following symptoms occur over the next 10 days:  You have a temperature by mouth above 102 F (38.9 C), not controlled by medicine.  Shortness of breath.  Weakness after normal activity.  The white part of the eye turns yellow (jaundice).  You have a decrease in the amount of urine or are urinating less often.  Your urine turns a dark color or changes to pink, red, or brown. Document Released: 04/20/2000 Document Revised: 07/16/2011 Document Reviewed: 12/08/2007 [facility] Patient Information 2014 [place], [place].  _______________________________________________________________________

## 2018-09-16 ENCOUNTER — Encounter (HOSPITAL_COMMUNITY): Payer: Self-pay

## 2018-09-16 ENCOUNTER — Encounter (HOSPITAL_COMMUNITY)
Admission: RE | Admit: 2018-09-16 | Discharge: 2018-09-16 | Disposition: A | Discharge status: Home / Self Care | Payer: Medicare Other | Source: Ambulatory Visit | Attending: Orthopedic Surgery | Admitting: Orthopedic Surgery

## 2018-09-16 ENCOUNTER — Other Ambulatory Visit: Payer: Self-pay

## 2018-09-16 DIAGNOSIS — M1611 Unilateral primary osteoarthritis, right hip: Secondary | ICD-10-CM | POA: Insufficient documentation

## 2018-09-16 DIAGNOSIS — Z01812 Encounter for preprocedural laboratory examination: Secondary | ICD-10-CM | POA: Insufficient documentation

## 2018-09-16 DIAGNOSIS — Z1159 Encounter for screening for other viral diseases: Secondary | ICD-10-CM | POA: Diagnosis not present

## 2018-09-16 HISTORY — DX: Hypothyroidism, unspecified: E03.9

## 2018-09-16 HISTORY — DX: Personal history of colon polyps, unspecified: Z86.0100

## 2018-09-16 HISTORY — DX: Pneumonia, unspecified organism: J18.9

## 2018-09-16 HISTORY — DX: Personal history of colonic polyps: Z86.010

## 2018-09-16 HISTORY — DX: Other seasonal allergic rhinitis: J30.2

## 2018-09-16 HISTORY — DX: Vitamin D deficiency, unspecified: E55.9

## 2018-09-16 HISTORY — DX: Primary adrenocortical insufficiency: E27.1

## 2018-09-16 LAB — COMPREHENSIVE METABOLIC PANEL
ALT: 34 U/L (ref 0–44)
AST: 19 U/L (ref 15–41)
Albumin: 4.2 g/dL (ref 3.5–5.0)
Alkaline Phosphatase: 45 U/L (ref 38–126)
Anion gap: 8 (ref 5–15)
BUN: 15 mg/dL (ref 8–23)
CO2: 29 mmol/L (ref 22–32)
Calcium: 9.3 mg/dL (ref 8.9–10.3)
Chloride: 104 mmol/L (ref 98–111)
Creatinine, Ser: 0.81 mg/dL (ref 0.44–1.00)
GFR calc Af Amer: >60 mL/min (ref >60)
GFR calc non Af Amer: >60 mL/min (ref >60)
Glucose, Bld: 123 mg/dL — ABNORMAL HIGH (ref 70–99)
Potassium: 3.6 mmol/L (ref 3.5–5.1)
Sodium: 141 mmol/L (ref 135–145)
Total Bilirubin: 0.7 mg/dL (ref 0.3–1.2)
Total Protein: 6.7 g/dL (ref 6.5–8.1)

## 2018-09-16 LAB — SURGICAL PCR SCREEN
MRSA, PCR: NEGATIVE
Staphylococcus aureus: NEGATIVE

## 2018-09-16 LAB — CBC
HCT: 49.8 % — ABNORMAL HIGH (ref 36.0–46.0)
Hemoglobin: 16 g/dL — ABNORMAL HIGH (ref 12.0–15.0)
MCH: 32.1 pg (ref 26.0–34.0)
MCHC: 32.1 g/dL (ref 30.0–36.0)
MCV: 99.8 fL (ref 80.0–100.0)
Platelets: 250 10*3/uL (ref 150–400)
RBC: 4.99 MIL/uL (ref 3.87–5.11)
RDW: 13 % (ref 11.5–15.5)
WBC: 7.9 10*3/uL (ref 4.0–10.5)
nRBC: 0 % (ref 0.0–0.2)

## 2018-09-16 LAB — PROTIME-INR
INR: 0.9 (ref 0.8–1.2)
Prothrombin Time: 12.5 seconds (ref 11.4–15.2)

## 2018-09-16 LAB — APTT: aPTT: 22 seconds — ABNORMAL LOW (ref 24–36)

## 2018-09-16 LAB — ABO/RH: ABO/RH(D): A POS

## 2018-09-16 NOTE — H&P (Signed)
TOTAL HIP ADMISSION H&P  Patient is admitted for right total hip arthroplasty.  Subjective:  Chief Complaint: right hip pain  HPI: Krystal Swanson, 68 y.o. female, has a history of pain and functional disability in the right hip(s) due to avascular necrosis and patient has failed non-surgical conservative treatments for greater than 12 weeks to include corticosteriod injections and activity modification.  Onset of symptoms was gradual starting 2 years ago with gradually worsening course since that time.The patient noted no past surgery on the right hip(s).  Patient currently rates pain in the right hip at 8 out of 10 with activity. Patient has night pain, worsening of pain with activity and weight bearing and pain that interfers with activities of daily living. Patient has evidence of bone-on-bone arthritis in the right hip with subchondral cystic formation and osteophyte formation by imaging studies. This condition presents safety issues increasing the risk of falls. There is no current active infection.  Patient Active Problem List   Diagnosis Date Noted  . Chronic pain of left knee 01/17/2017  . Trochanteric bursitis of right hip 01/17/2017  . Addison's disease ([place]) 03/07/2016  . Hypothyroid 03/07/2016  . Vitamin D deficiency 03/07/2016   Past Medical History:  Diagnosis Date  . Addison's disease ([place])   . Anxiety   . Arthritis   . Depression   . Diverticulosis 2019   Noted on colonoscopy  . GERD (gastroesophageal reflux disease)   . History of colon polyps   . Hyperlipidemia   . Hypothyroidism   . Osteoporosis     Past Surgical History:  Procedure Laterality Date  . colon polyps  2009  . COLONOSCOPY  2014   Colonic polyp. Mild sigmoid diverticulosis.   [facility] UPPER GI ENDOSCOPY      No current facility-administered medications for this encounter.    Current Outpatient Medications  Medication Sig Dispense Refill Last Dose  . acetaminophen (TYLENOL) 500 MG tablet Take  1,000 mg by mouth every 6 (six) hours as needed for mild pain or headache.     . Cholecalciferol (VITAMIN D) 2000 units CAPS Take 2,000 Units by mouth daily.    Taking  . diphenhydramine-acetaminophen (TYLENOL PM) 25-500 MG TABS tablet Take 1 tablet by mouth at bedtime as needed (sleep).     . hydrocortisone (CORTEF) 10 MG tablet Take 15-20 mg by mouth See admin instructions. Take 2 tablets in the AM and 1 1/2 in the afternoon  1 Taking  . levothyroxine (SYNTHROID, LEVOTHROID) 88 MCG tablet Take 88 mcg by mouth daily before breakfast.    Taking  . omeprazole (PRILOSEC) 20 MG capsule Take 20 mg by mouth daily.   Taking  . rosuvastatin (CRESTOR) 10 MG tablet Take 10 mg by mouth daily.   Taking  . venlafaxine XR (EFFEXOR-XR) 75 MG 24 hr capsule Take 75 mg by mouth daily.   Taking  . vitamin C (ASCORBIC ACID) 500 MG tablet Take 500 mg by mouth daily.     Marland Kitchen aspirin EC 81 MG tablet Take 81 mg by mouth daily.     . Multiple Vitamins-Minerals (CENTRUM WOMEN PO) Take 1 tablet by mouth daily.    Taking  . Omega-3 Fatty Acids (FISH OIL) 1200 MG CAPS Take 1,200 mg by mouth daily.      No Known Allergies  Social History   Tobacco Use  . Smoking status: Never Smoker  . Smokeless tobacco: Never Used  Substance Use Topics  . Alcohol use: Yes    Frequency: Never  Comment: social    Family History  Problem Relation Age of Onset  . Peptic Ulcer Mother   . Heart attack Father   . Stroke Father   . Peptic Ulcer Father        lost 6 pints of blood  . Colon cancer Maternal Aunt   . Breast cancer Maternal Aunt      Review of Systems  Constitutional: Negative for chills and fever.  HENT: Negative for congestion, sore throat and tinnitus.   Eyes: Negative for double vision, photophobia and pain.  Respiratory: Negative for cough, shortness of breath and wheezing.   Cardiovascular: Negative for chest pain, palpitations and orthopnea.  Gastrointestinal: Negative for heartburn, nausea and vomiting.   Genitourinary: Negative for dysuria, frequency and urgency.  Musculoskeletal: Positive for joint pain.  Neurological: Negative for dizziness, weakness and headaches.    Objective:  Physical Exam  Well nourished and well developed. General: Alert and oriented x3, cooperative and pleasant, no acute distress. Head: normocephalic, atraumatic, neck supple. Eyes: EOMI. Respiratory: breath sounds clear in all fields, no wheezing, rales, or rhonchi. Cardiovascular: Regular rate and rhythm, no murmurs, gallops or rubs.  Abdomen: non-tender to palpation and soft, normoactive bowel sounds.  Right Hip Exam: ROM: Flexion to 110, No Internal Rotation, External Rotation 20, and abduction 20. She has pain on abduction and external rotation.  There is no tenderness over the greater trochanter bursa.  There is pain on provocative testing of the hip.  Calves soft and nontender. Motor function intact in LE. Strength 5/5 LE bilaterally. Neuro: Distal pulses 2+. Sensation to light touch intact in LE.  Vital signs in last 24 hours: Blood pressure: 142/78 mmHg Pulse: 76 bpm  Labs:   Estimated body mass index is 27.47 kg/m as calculated from the following:   Height as of 05/08/18: 4\' 11"  (1.499 m).   Weight as of 05/08/18: 61.7 kg.   Imaging Review Plain radiographs demonstrate severe degenerative joint disease of the right hip(s). The bone quality appears to be adequate for age and reported activity level.      Assessment/Plan:  End stage arthritis, right hip(s)  The patient history, physical examination, clinical judgement of the provider and imaging studies are consistent with end stage degenerative joint disease of the right hip(s) and total hip arthroplasty is deemed medically necessary. The treatment options including medical management, injection therapy, arthroscopy and arthroplasty were discussed at length. The risks and benefits of total hip arthroplasty were presented and reviewed. The  risks due to aseptic loosening, infection, stiffness, dislocation/subluxation,  thromboembolic complications and other imponderables were discussed.  The patient acknowledged the explanation, agreed to proceed with the plan and consent was signed. Patient is being admitted for inpatient treatment for surgery, pain control, PT, OT, prophylactic antibiotics, VTE prophylaxis, progressive ambulation and ADL's and discharge planning.The patient is planning to be discharged home.   Anticipated LOS equal to or greater than 2 midnights due to - Age 24 and older with one or more of the following:  - Obesity  - Expected need for hospital services (PT, OT, Nursing) required for safe  discharge  - Anticipated need for postoperative skilled nursing care or inpatient rehab  - Active co-morbidities: None OR   - Unanticipated findings during/Post Surgery: None  - Patient is a high risk of re-admission due to: None   Therapy Plans: HEP Disposition: Home with husband Planned DVT Prophylaxis: Xarelto 10 mg daily (extensive family history of DVTs) DME needed: None PCP: Lucita Lora,  NP TXA: IV Allergies: NKDA Anesthesia Concerns: None BMI: 27.9 Other: Pt has Addison's disease, endocrinologist sending instructions to anesthesia  Endocrinologist Instructions: Hx of Addison's disease - takes hydrocortisone 20 mg qAM and 15 mg qPM at baseline.  The day prior to surgery, double morning and evening doses of oral hydrocortisone. Day of surgery, IV hydrocortisone 50 mg prior to surgery. Normal afternoon dose day of surgery. POD #1-4, take double normal morning and evening dose of oral hydrocortisone.  - Patient was instructed on what medications to stop prior to surgery. - Follow-up visit in 2 weeks with Dr. Wynelle Link - Begin physical therapy following surgery - Pre-operative lab work as pre-surgical testing - Prescriptions will be provided in hospital at time of discharge  Theresa Duty, PA-C Orthopedic  Surgery [facility] [place]

## 2018-09-18 ENCOUNTER — Other Ambulatory Visit (HOSPITAL_COMMUNITY)
Admission: RE | Admit: 2018-09-18 | Discharge: 2018-09-18 | Disposition: A | Discharge status: Home / Self Care | Payer: Medicare Other | Source: Ambulatory Visit | Attending: Orthopedic Surgery | Admitting: Orthopedic Surgery

## 2018-09-18 DIAGNOSIS — Z01812 Encounter for preprocedural laboratory examination: Secondary | ICD-10-CM | POA: Diagnosis not present

## 2018-09-18 NOTE — Anesthesia Preprocedure Evaluation (Addendum)
Anesthesia Evaluation  Patient identified by MRN, date of birth, ID band Patient awake    Reviewed: Allergy & Precautions, NPO status , Patient's Chart, lab work & pertinent test results  Airway Mallampati: II  TM Distance: >3 FB Neck ROM: Full    Dental no notable dental hx. (+) Teeth Intact   Pulmonary former smoker,    Pulmonary exam normal breath sounds clear to auscultation       Cardiovascular Exercise Tolerance: Good Normal cardiovascular exam Rhythm:Regular Rate:Normal     Neuro/Psych Depression    GI/Hepatic Neg liver ROS, GERD  ,  Endo/Other  Hypothyroidism Hx of Addisons Dz  Stress dose Steroids w 2x (30mg ) Hydrocortisone post op per endocrinologist  Renal/GU Cr 0.81  K+ 3.6     Musculoskeletal  (+) Arthritis , Osteoarthritis,    Abdominal   Peds  Hematology Hgb 16.0 Plt 250   Anesthesia Other Findings   Reproductive/Obstetrics                           Anesthesia Physical Anesthesia Plan  ASA: II  Anesthesia Plan: Spinal   Post-op Pain Management:    Induction:   PONV Risk Score and Plan: Treatment may vary due to age or medical condition  Airway Management Planned: Simple Face Mask and Natural Airway  Additional Equipment:   Intra-op Plan:   Post-operative Plan:   Informed Consent: I have reviewed the patients History and Physical, chart, labs and discussed the procedure including the risks, benefits and alternatives for the proposed anesthesia with the patient or authorized representative who has indicated his/her understanding and acceptance.     Dental advisory given  Plan Discussed with: CRNA  Anesthesia Plan Comments: (See PAT note 09/16/18, Konrad Felix, PA-C, instructions per endocrinologist outlined  50 mg hydrocortisone IV preop  PAcu 30 mg  R THR w Spinal)      Anesthesia Quick Evaluation

## 2018-09-18 NOTE — Progress Notes (Signed)
Anesthesia Chart Review   Case:  [id number] Date/Time:  09/22/18 1015   Procedure:  TOTAL HIP ARTHROPLASTY ANTERIOR APPROACH (Right )   Anesthesia type:  Choice   Pre-op diagnosis:  right hip osteoarthritis   Location:  [facility] ROOM 09 / [facility]   Surgeon:  Gaynelle Arabian, MD      DISCUSSION:68 yo former smoker (15 pack years) with h/o anxiety, depression, HLD, Addison's disease, GERD, hypothyroidism, right hip OA scheduled for above procedure 09/22/18 with Dr. Gaynelle Arabian.   Pt followed by endocrinologist.  Per Riccardo Dubin, PA-C, "Ms. Souder is on hydrocortisone 20mg  qAM and 15mg  qPM at baseline.  The day prior to surgery please double your normal morning and evening doses of hydrocortisone. Day of surgery receive IV Hydrocortisone 50mg  prior to surgery.  Once you are in post-op later that day you can take double your afternoon dose of oral hydrocortisone.  From post-op day on to post-op day four take double your normal morning and evening doses of oral hydrocortisone."  This information has been given to the patient.    Pt can proceed with planned procedure barring acute status change.   VS: BP (!) 149/83 (BP Location: Left Arm)   Pulse 99   Temp 37.1 C (Oral)   Resp 18   Ht 4\' 11"  (1.499 m)   Wt 60.6 kg   SpO2 99%   BMI 26.96 kg/m   PROVIDERS: Charlynn Court, NP is PCP    LABS: Labs reviewed: Acceptable for surgery. (all labs ordered are listed, but only abnormal results are displayed)  Labs Reviewed  APTT - Abnormal; Notable for the following components:      Result Value   aPTT 22 (*)    All other components within normal limits  CBC - Abnormal; Notable for the following components:   Hemoglobin 16.0 (*)    HCT 49.8 (*)    All other components within normal limits  COMPREHENSIVE METABOLIC PANEL - Abnormal; Notable for the following components:   Glucose, Bld 123 (*)    All other components within normal limits  SURGICAL PCR SCREEN  PROTIME-INR  TYPE AND  SCREEN  ABO/RH     IMAGES:   EKG:   CV:  Past Medical History:  Diagnosis Date  . Addison's disease ([place])   . Anxiety   . Arthritis   . Depression   . Diverticulosis 2019   Noted on colonoscopy  . GERD (gastroesophageal reflux disease)   . History of colon polyps   . Hyperlipidemia   . Hypothyroidism   . Osteoporosis   . Pneumonia   . Seasonal allergies   . Vitamin D deficiency     Past Surgical History:  Procedure Laterality Date  . colon polyps  2009  . COLONOSCOPY  2014   Colonic polyp. Mild sigmoid diverticulosis.   [facility]      MEDICATIONS: . acetaminophen (TYLENOL) 500 MG tablet  . aspirin EC 81 MG tablet  . Cholecalciferol (VITAMIN D) 2000 units CAPS  . diphenhydramine-acetaminophen (TYLENOL PM) 25-500 MG TABS tablet  . hydrocortisone (CORTEF) 10 MG tablet  . levothyroxine (SYNTHROID, LEVOTHROID) 88 MCG tablet  . Multiple Vitamins-Minerals (CENTRUM WOMEN PO)  . Omega-3 Fatty Acids (FISH OIL) 1200 MG CAPS  . omeprazole (PRILOSEC) 20 MG capsule  . rosuvastatin (CRESTOR) 10 MG tablet  . venlafaxine XR (EFFEXOR-XR) 75 MG 24 hr capsule  . vitamin C (ASCORBIC ACID) 500 MG tablet   No current facility-administered medications  for this encounter.     Maia Plan [facility] Pre-Surgical Testing [phone] 09/18/18 3:05 PM

## 2018-09-19 LAB — NOVEL CORONAVIRUS, NAA (HOSP ORDER, SEND-OUT TO REF LAB; TAT 18-24 HRS): SARS-CoV-2, NAA: NOT DETECTED

## 2018-09-22 ENCOUNTER — Inpatient Hospital Stay (HOSPITAL_COMMUNITY): Payer: Medicare Other

## 2018-09-22 ENCOUNTER — Inpatient Hospital Stay (HOSPITAL_COMMUNITY): Payer: Medicare Other | Admitting: Anesthesiology

## 2018-09-22 ENCOUNTER — Inpatient Hospital Stay (HOSPITAL_COMMUNITY): Payer: Medicare Other | Admitting: Physician Assistant

## 2018-09-22 ENCOUNTER — Encounter (HOSPITAL_COMMUNITY)
Admission: RE | Disposition: A | Discharge status: Home / Self Care | Payer: Self-pay | Source: Home / Self Care | Attending: Orthopedic Surgery

## 2018-09-22 ENCOUNTER — Inpatient Hospital Stay (HOSPITAL_COMMUNITY)
Admission: RE | Admit: 2018-09-22 | Discharge: 2018-09-23 | DRG: 470 | Disposition: A | Discharge status: Home / Self Care | Payer: Medicare Other | Attending: Orthopedic Surgery | Admitting: Orthopedic Surgery

## 2018-09-22 ENCOUNTER — Encounter (HOSPITAL_COMMUNITY): Payer: Self-pay | Admitting: Emergency Medicine

## 2018-09-22 ENCOUNTER — Other Ambulatory Visit: Payer: Self-pay

## 2018-09-22 DIAGNOSIS — Z96649 Presence of unspecified artificial hip joint: Secondary | ICD-10-CM

## 2018-09-22 DIAGNOSIS — K5730 Diverticulosis of large intestine without perforation or abscess without bleeding: Secondary | ICD-10-CM | POA: Diagnosis present

## 2018-09-22 DIAGNOSIS — E039 Hypothyroidism, unspecified: Secondary | ICD-10-CM | POA: Diagnosis present

## 2018-09-22 DIAGNOSIS — E785 Hyperlipidemia, unspecified: Secondary | ICD-10-CM | POA: Diagnosis present

## 2018-09-22 DIAGNOSIS — Z803 Family history of malignant neoplasm of breast: Secondary | ICD-10-CM

## 2018-09-22 DIAGNOSIS — Z6826 Body mass index (BMI) 26.0-26.9, adult: Secondary | ICD-10-CM

## 2018-09-22 DIAGNOSIS — Z823 Family history of stroke: Secondary | ICD-10-CM

## 2018-09-22 DIAGNOSIS — M1611 Unilateral primary osteoarthritis, right hip: Principal | ICD-10-CM | POA: Diagnosis present

## 2018-09-22 DIAGNOSIS — Z79890 Hormone replacement therapy (postmenopausal): Secondary | ICD-10-CM | POA: Diagnosis not present

## 2018-09-22 DIAGNOSIS — E271 Primary adrenocortical insufficiency: Secondary | ICD-10-CM | POA: Diagnosis present

## 2018-09-22 DIAGNOSIS — Z7982 Long term (current) use of aspirin: Secondary | ICD-10-CM | POA: Diagnosis not present

## 2018-09-22 DIAGNOSIS — F329 Major depressive disorder, single episode, unspecified: Secondary | ICD-10-CM | POA: Diagnosis present

## 2018-09-22 DIAGNOSIS — E669 Obesity, unspecified: Secondary | ICD-10-CM | POA: Diagnosis present

## 2018-09-22 DIAGNOSIS — Z8249 Family history of ischemic heart disease and other diseases of the circulatory system: Secondary | ICD-10-CM

## 2018-09-22 DIAGNOSIS — Z87891 Personal history of nicotine dependence: Secondary | ICD-10-CM | POA: Diagnosis not present

## 2018-09-22 DIAGNOSIS — M25559 Pain in unspecified hip: Secondary | ICD-10-CM

## 2018-09-22 DIAGNOSIS — E559 Vitamin D deficiency, unspecified: Secondary | ICD-10-CM | POA: Diagnosis present

## 2018-09-22 DIAGNOSIS — K219 Gastro-esophageal reflux disease without esophagitis: Secondary | ICD-10-CM | POA: Diagnosis present

## 2018-09-22 DIAGNOSIS — Z79899 Other long term (current) drug therapy: Secondary | ICD-10-CM

## 2018-09-22 DIAGNOSIS — F419 Anxiety disorder, unspecified: Secondary | ICD-10-CM | POA: Diagnosis present

## 2018-09-22 DIAGNOSIS — M810 Age-related osteoporosis without current pathological fracture: Secondary | ICD-10-CM | POA: Diagnosis present

## 2018-09-22 DIAGNOSIS — M25551 Pain in right hip: Secondary | ICD-10-CM | POA: Diagnosis present

## 2018-09-22 DIAGNOSIS — Z800 Family history of malignant neoplasm of digestive organs: Secondary | ICD-10-CM | POA: Diagnosis not present

## 2018-09-22 DIAGNOSIS — M169 Osteoarthritis of hip, unspecified: Secondary | ICD-10-CM | POA: Diagnosis present

## 2018-09-22 HISTORY — PX: TOTAL HIP ARTHROPLASTY: SHX124

## 2018-09-22 LAB — TYPE AND SCREEN
ABO/RH(D): A POS
Antibody Screen: NEGATIVE

## 2018-09-22 SURGERY — ARTHROPLASTY, HIP, TOTAL, ANTERIOR APPROACH
Anesthesia: Spinal | Site: Hip | Laterality: Right

## 2018-09-22 MED ORDER — MIDAZOLAM HCL 2 MG/2ML IJ SOLN
INTRAMUSCULAR | Status: AC
  Filled 2018-09-22: qty 2

## 2018-09-22 MED ORDER — BISACODYL 10 MG RE SUPP: 10.0000 mg | Freq: Every day | RECTAL | Status: DC | PRN

## 2018-09-22 MED ORDER — CEFAZOLIN SODIUM-DEXTROSE 2-4 GM/100ML-% IV SOLN
2.0000 g | INTRAVENOUS | Status: AC
  Administered 2018-09-22: 2 g via INTRAVENOUS
  Filled 2018-09-22: qty 100

## 2018-09-22 MED ORDER — BUPIVACAINE IN DEXTROSE 0.75-8.25 % IT SOLN
INTRATHECAL | Status: DC | PRN
  Administered 2018-09-22: 1.6 mL via INTRATHECAL

## 2018-09-22 MED ORDER — ACETAMINOPHEN 500 MG PO TABS
500.0000 mg | ORAL_TABLET | Freq: Four times a day (QID) | ORAL | Status: AC
  Administered 2018-09-22 – 2018-09-23 (×3): 500 mg via ORAL
  Filled 2018-09-22 (×3): qty 1

## 2018-09-22 MED ORDER — ONDANSETRON HCL 4 MG/2ML IJ SOLN
4.0000 mg | Freq: Four times a day (QID) | INTRAMUSCULAR | Status: DC | PRN
  Administered 2018-09-23: 06:00:00 4 mg via INTRAVENOUS
  Filled 2018-09-22: qty 2

## 2018-09-22 MED ORDER — ONDANSETRON HCL 4 MG PO TABS: 4.0000 mg | ORAL_TABLET | Freq: Four times a day (QID) | ORAL | Status: DC | PRN

## 2018-09-22 MED ORDER — MORPHINE SULFATE (PF) 2 MG/ML IV SOLN
0.5000 mg | INTRAVENOUS | Status: DC | PRN
  Administered 2018-09-23: 1 mg via INTRAVENOUS
  Filled 2018-09-22: qty 1

## 2018-09-22 MED ORDER — HYDROCODONE-ACETAMINOPHEN 5-325 MG PO TABS
1.0000 | ORAL_TABLET | ORAL | Status: DC | PRN
  Administered 2018-09-22 – 2018-09-23 (×4): 1 via ORAL
  Administered 2018-09-23: 2 via ORAL
  Administered 2018-09-23: 1 via ORAL
  Filled 2018-09-22 (×2): qty 1
  Filled 2018-09-22: qty 2
  Filled 2018-09-22 (×2): qty 1
  Filled 2018-09-22: qty 2

## 2018-09-22 MED ORDER — DEXAMETHASONE SODIUM PHOSPHATE 10 MG/ML IJ SOLN: 8.0000 mg | Freq: Once | INTRAMUSCULAR | Status: DC

## 2018-09-22 MED ORDER — FLEET ENEMA 7-19 GM/118ML RE ENEM: 1.0000 | ENEMA | Freq: Once | RECTAL | Status: DC | PRN

## 2018-09-22 MED ORDER — HYDROCORTISONE NA SUCCINATE PF 100 MG IJ SOLR
INTRAMUSCULAR | Status: AC
  Filled 2018-09-22: qty 2

## 2018-09-22 MED ORDER — SODIUM CHLORIDE 0.9 % IV SOLN
INTRAVENOUS | Status: DC
  Administered 2018-09-22 – 2018-09-23 (×2): via INTRAVENOUS

## 2018-09-22 MED ORDER — PANTOPRAZOLE SODIUM 40 MG PO TBEC
40.0000 mg | DELAYED_RELEASE_TABLET | Freq: Every day | ORAL | Status: DC
  Administered 2018-09-23: 40 mg via ORAL
  Filled 2018-09-22: qty 1

## 2018-09-22 MED ORDER — PROPOFOL 10 MG/ML IV BOLUS
INTRAVENOUS | Status: AC
  Filled 2018-09-22: qty 20

## 2018-09-22 MED ORDER — BUPIVACAINE-EPINEPHRINE (PF) 0.25% -1:200000 IJ SOLN
INTRAMUSCULAR | Status: DC | PRN
  Administered 2018-09-22: 30 mL

## 2018-09-22 MED ORDER — PROPOFOL 10 MG/ML IV BOLUS
INTRAVENOUS | Status: DC | PRN
  Administered 2018-09-22: 10 mg via INTRAVENOUS
  Administered 2018-09-22 (×3): 20 mg via INTRAVENOUS

## 2018-09-22 MED ORDER — ROSUVASTATIN CALCIUM 10 MG PO TABS
10.0000 mg | ORAL_TABLET | Freq: Every day | ORAL | Status: DC
  Administered 2018-09-23: 10 mg via ORAL
  Filled 2018-09-22: qty 1

## 2018-09-22 MED ORDER — HYDROCORTISONE 5 MG PO TABS
15.0000 mg | ORAL_TABLET | Freq: Every evening | ORAL | Status: DC
  Filled 2018-09-22 (×2): qty 1

## 2018-09-22 MED ORDER — METOCLOPRAMIDE HCL 5 MG/ML IJ SOLN: 5.0000 mg | Freq: Three times a day (TID) | INTRAMUSCULAR | Status: DC | PRN

## 2018-09-22 MED ORDER — POLYETHYLENE GLYCOL 3350 17 G PO PACK: 17.0000 g | PACK | Freq: Every day | ORAL | Status: DC | PRN

## 2018-09-22 MED ORDER — DOCUSATE SODIUM 100 MG PO CAPS
100.0000 mg | ORAL_CAPSULE | Freq: Two times a day (BID) | ORAL | Status: DC
  Administered 2018-09-22 – 2018-09-23 (×2): 100 mg via ORAL
  Filled 2018-09-22 (×2): qty 1

## 2018-09-22 MED ORDER — ACETAMINOPHEN 10 MG/ML IV SOLN
1000.0000 mg | Freq: Four times a day (QID) | INTRAVENOUS | Status: DC
  Administered 2018-09-22: 1000 mg via INTRAVENOUS
  Filled 2018-09-22: qty 100

## 2018-09-22 MED ORDER — LIDOCAINE 2% (20 MG/ML) 5 ML SYRINGE
INTRAMUSCULAR | Status: AC
  Filled 2018-09-22: qty 5

## 2018-09-22 MED ORDER — LEVOTHYROXINE SODIUM 88 MCG PO TABS
88.0000 ug | ORAL_TABLET | Freq: Every day | ORAL | Status: DC
  Administered 2018-09-23: 05:00:00 88 ug via ORAL
  Filled 2018-09-22: qty 1

## 2018-09-22 MED ORDER — HYDROCORTISONE 20 MG PO TABS
20.0000 mg | ORAL_TABLET | Freq: Every day | ORAL | Status: DC
  Filled 2018-09-22: qty 1

## 2018-09-22 MED ORDER — METHOCARBAMOL 500 MG IVPB - SIMPLE MED
500.0000 mg | Freq: Four times a day (QID) | INTRAVENOUS | Status: DC | PRN
  Filled 2018-09-22: qty 50

## 2018-09-22 MED ORDER — VENLAFAXINE HCL ER 75 MG PO CP24
75.0000 mg | ORAL_CAPSULE | Freq: Every day | ORAL | Status: DC
  Administered 2018-09-23: 75 mg via ORAL
  Filled 2018-09-22: qty 1

## 2018-09-22 MED ORDER — HYDROMORPHONE HCL 1 MG/ML IJ SOLN
INTRAMUSCULAR | Status: AC
  Filled 2018-09-22: qty 1

## 2018-09-22 MED ORDER — DIPHENHYDRAMINE HCL 12.5 MG/5ML PO ELIX: 12.5000 mg | ORAL_SOLUTION | ORAL | Status: DC | PRN

## 2018-09-22 MED ORDER — METOCLOPRAMIDE HCL 5 MG PO TABS: 5.0000 mg | ORAL_TABLET | Freq: Three times a day (TID) | ORAL | Status: DC | PRN

## 2018-09-22 MED ORDER — PROPOFOL 500 MG/50ML IV EMUL
INTRAVENOUS | Status: DC | PRN
  Administered 2018-09-22: 75 ug/kg/min via INTRAVENOUS

## 2018-09-22 MED ORDER — FENTANYL CITRATE (PF) 100 MCG/2ML IJ SOLN
INTRAMUSCULAR | Status: DC | PRN
  Administered 2018-09-22: 100 ug via INTRAVENOUS

## 2018-09-22 MED ORDER — HYDROCORTISONE 20 MG PO TABS: 20.0000 mg | ORAL_TABLET | ORAL | Status: DC

## 2018-09-22 MED ORDER — ONDANSETRON HCL 4 MG/2ML IJ SOLN
INTRAMUSCULAR | Status: AC
  Filled 2018-09-22: qty 2

## 2018-09-22 MED ORDER — METHOCARBAMOL 500 MG PO TABS
500.0000 mg | ORAL_TABLET | Freq: Four times a day (QID) | ORAL | Status: DC | PRN
  Administered 2018-09-22 – 2018-09-23 (×2): 500 mg via ORAL
  Filled 2018-09-22 (×2): qty 1

## 2018-09-22 MED ORDER — HYDROCORTISONE NA SUCCINATE PF 100 MG IJ SOLR
INTRAMUSCULAR | Status: DC | PRN
  Administered 2018-09-22: 50 mg via INTRAVENOUS

## 2018-09-22 MED ORDER — HYDROMORPHONE HCL 1 MG/ML IJ SOLN
0.2500 mg | INTRAMUSCULAR | Status: DC | PRN
  Administered 2018-09-22 (×2): 0.5 mg via INTRAVENOUS

## 2018-09-22 MED ORDER — PHENOL 1.4 % MT LIQD: 1.0000 | OROMUCOSAL | Status: DC | PRN

## 2018-09-22 MED ORDER — PROPOFOL 10 MG/ML IV BOLUS
INTRAVENOUS | Status: AC
  Filled 2018-09-22: qty 60

## 2018-09-22 MED ORDER — STERILE WATER FOR IRRIGATION IR SOLN
Status: DC | PRN
  Administered 2018-09-22: 2000 mL

## 2018-09-22 MED ORDER — ASPIRIN EC 325 MG PO TBEC
325.0000 mg | DELAYED_RELEASE_TABLET | Freq: Two times a day (BID) | ORAL | Status: DC
  Administered 2018-09-23: 325 mg via ORAL
  Filled 2018-09-22: qty 1

## 2018-09-22 MED ORDER — FENTANYL CITRATE (PF) 100 MCG/2ML IJ SOLN
INTRAMUSCULAR | Status: AC
  Filled 2018-09-22: qty 2

## 2018-09-22 MED ORDER — MENTHOL 3 MG MT LOZG: 1.0000 | LOZENGE | OROMUCOSAL | Status: DC | PRN

## 2018-09-22 MED ORDER — HYDROCORTISONE 20 MG PO TABS
30.0000 mg | ORAL_TABLET | Freq: Once | ORAL | Status: AC
  Administered 2018-09-22: 30 mg via ORAL
  Filled 2018-09-22: qty 1

## 2018-09-22 MED ORDER — BUPIVACAINE-EPINEPHRINE (PF) 0.25% -1:200000 IJ SOLN
INTRAMUSCULAR | Status: AC
  Filled 2018-09-22: qty 30

## 2018-09-22 MED ORDER — MIDAZOLAM HCL 5 MG/5ML IJ SOLN
INTRAMUSCULAR | Status: DC | PRN
  Administered 2018-09-22: 2 mg via INTRAVENOUS

## 2018-09-22 MED ORDER — CHLORHEXIDINE GLUCONATE 4 % EX LIQD: 60.0000 mL | Freq: Once | CUTANEOUS | Status: DC

## 2018-09-22 MED ORDER — LACTATED RINGERS IV SOLN
INTRAVENOUS | Status: DC
  Administered 2018-09-22 (×2): via INTRAVENOUS

## 2018-09-22 MED ORDER — POVIDONE-IODINE 10 % EX SWAB
2.0000 "application " | Freq: Once | CUTANEOUS | Status: AC
  Administered 2018-09-22: 2 via TOPICAL

## 2018-09-22 MED ORDER — MEPERIDINE HCL 50 MG/ML IJ SOLN: 6.2500 mg | INTRAMUSCULAR | Status: DC | PRN

## 2018-09-22 MED ORDER — CEFAZOLIN SODIUM-DEXTROSE 1-4 GM/50ML-% IV SOLN
1.0000 g | Freq: Four times a day (QID) | INTRAVENOUS | Status: AC
  Administered 2018-09-22 (×2): 1 g via INTRAVENOUS
  Filled 2018-09-22 (×2): qty 50

## 2018-09-22 MED ORDER — HYDROCODONE-ACETAMINOPHEN 7.5-325 MG PO TABS: 1.0000 | ORAL_TABLET | Freq: Once | ORAL | Status: DC | PRN

## 2018-09-22 MED ORDER — ACETAMINOPHEN 10 MG/ML IV SOLN: 1000.0000 mg | Freq: Once | INTRAVENOUS | Status: DC | PRN

## 2018-09-22 MED ORDER — ONDANSETRON HCL 4 MG/2ML IJ SOLN
INTRAMUSCULAR | Status: DC | PRN
  Administered 2018-09-22: 4 mg via INTRAVENOUS

## 2018-09-22 MED ORDER — TRANEXAMIC ACID-NACL 1000-0.7 MG/100ML-% IV SOLN
1000.0000 mg | INTRAVENOUS | Status: AC
  Administered 2018-09-22: 1000 mg via INTRAVENOUS
  Filled 2018-09-22: qty 100

## 2018-09-22 MED ORDER — 0.9 % SODIUM CHLORIDE (POUR BTL) OPTIME
TOPICAL | Status: DC | PRN
  Administered 2018-09-22: 11:00:00 1000 mL

## 2018-09-22 MED ORDER — PROMETHAZINE HCL 25 MG/ML IJ SOLN: 6.2500 mg | INTRAMUSCULAR | Status: DC | PRN

## 2018-09-22 SURGICAL SUPPLY — 43 items
BAG DECANTER FOR FLEXI CONT (MISCELLANEOUS) ×3 IMPLANT
BAG ZIPLOCK 12X15 (MISCELLANEOUS) IMPLANT
BLADE SAG 18X100X1.27 (BLADE) ×3 IMPLANT
BLADE SURG SZ10 CARB STEEL (BLADE) ×6 IMPLANT
CHLORAPREP W/TINT 26 (MISCELLANEOUS) ×3 IMPLANT
CLOSURE WOUND 1/2 X4 (GAUZE/BANDAGES/DRESSINGS) ×2
COVER PERINEAL POST (MISCELLANEOUS) ×3 IMPLANT
COVER SURGICAL LIGHT HANDLE (MISCELLANEOUS) ×3 IMPLANT
COVER WAND RF STERILE (DRAPES) IMPLANT
CUP ACETBLR 48 OD SECTOR II (Hips) ×3 IMPLANT
DECANTER SPIKE VIAL GLASS SM (MISCELLANEOUS) ×3 IMPLANT
DRAPE STERI IOBAN 125X83 (DRAPES) ×3 IMPLANT
DRAPE U-SHAPE 47X51 STRL (DRAPES) ×6 IMPLANT
DRSG ADAPTIC 3X8 NADH LF (GAUZE/BANDAGES/DRESSINGS) ×3 IMPLANT
DRSG MEPILEX BORDER 4X4 (GAUZE/BANDAGES/DRESSINGS) ×3 IMPLANT
DRSG MEPILEX BORDER 4X8 (GAUZE/BANDAGES/DRESSINGS) ×3 IMPLANT
ELECT BLADE TIP CTD 4 INCH (ELECTRODE) ×3 IMPLANT
ELECT REM PT RETURN 15FT ADLT (MISCELLANEOUS) ×3 IMPLANT
EVACUATOR 1/8 PVC DRAIN (DRAIN) ×3 IMPLANT
GLOVE BIO SURGEON STRL SZ7 (GLOVE) ×3 IMPLANT
GLOVE BIO SURGEON STRL SZ8 (GLOVE) ×3 IMPLANT
GLOVE BIOGEL PI IND STRL 7.0 (GLOVE) ×1 IMPLANT
GLOVE BIOGEL PI IND STRL 8 (GLOVE) ×1 IMPLANT
GLOVE BIOGEL PI INDICATOR 7.0 (GLOVE) ×2
GLOVE BIOGEL PI INDICATOR 8 (GLOVE) ×2
GOWN STRL REUS W/TWL LRG LVL3 (GOWN DISPOSABLE) ×6 IMPLANT
HEAD CERAMIC DELTA 28 P1.5 HIP (Head) ×3 IMPLANT
HOLDER FOLEY CATH W/STRAP (MISCELLANEOUS) ×3 IMPLANT
KIT TURNOVER KIT A (KITS) IMPLANT
LINER MARATHON 28 48 (Hips) ×3 IMPLANT
MANIFOLD NEPTUNE II (INSTRUMENTS) ×3 IMPLANT
PACK ANTERIOR HIP CUSTOM (KITS) ×3 IMPLANT
STEM FEM ACTIS STD SZ4 (Stem) ×3 IMPLANT
STRIP CLOSURE SKIN 1/2X4 (GAUZE/BANDAGES/DRESSINGS) ×4 IMPLANT
SUT ETHIBOND NAB CT1 #1 30IN (SUTURE) ×3 IMPLANT
SUT MNCRL AB 4-0 PS2 18 (SUTURE) ×3 IMPLANT
SUT STRATAFIX 0 PDS 27 VIOLET (SUTURE) ×3
SUT VIC AB 2-0 CT1 27 (SUTURE) ×6
SUT VIC AB 2-0 CT1 TAPERPNT 27 (SUTURE) ×3 IMPLANT
SUTURE STRATFX 0 PDS 27 VIOLET (SUTURE) ×1 IMPLANT
SYR 50ML LL SCALE MARK (SYRINGE) IMPLANT
TRAY FOLEY MTR SLVR 14FR STAT (SET/KITS/TRAYS/PACK) ×3 IMPLANT
YANKAUER SUCT BULB TIP 10FT TU (MISCELLANEOUS) ×3 IMPLANT

## 2018-09-22 NOTE — Evaluation (Signed)
Physical Therapy Evaluation Patient Details Name: Krystal Swanson MRN: [id number] DOB: 10/10/1950 Today's Date: 09/22/2018   History of Present Illness  R  AA-THA  Clinical Impression  Pt is s/p THA resulting in the deficits listed below (see PT Problem List). Pt ambulated 44' with RW, no loss of balance. Initiated THA HEP. Good progress expected. Pt stated she hoped to get outside on the ground to pull weeds when she gets home. Educated pt that sitting on the ground will not be a safe activity for a couple weeks. Pt was encouraged to limit activity to  ambulation and performing HEP at home, and then discuss activity progression with MD at her follow up visit in 2 weeks.  Pt will benefit from skilled PT to increase their independence and safety with mobility to allow discharge to the venue listed below.      Follow Up Recommendations Follow surgeon's recommendation for DC plan and follow-up therapies    Equipment Recommendations  Rolling walker with 5" wheels    Recommendations for Other Services       Precautions / Restrictions Precautions Precautions: Fall Precaution Comments: pt denies h/o falls Restrictions Weight Bearing Restrictions: No      Mobility  Bed Mobility Overal bed mobility: Needs Assistance Bed Mobility: Supine to Sit     Supine to sit: Min assist     General bed mobility comments: min A to advance RLE  Transfers Overall transfer level: Needs assistance Equipment used: Rolling walker (2 wheeled) Transfers: Sit to/from Stand Sit to Stand: Min guard         General transfer comment: VCs hand placement, min/guard for safety  Ambulation/Gait Ambulation/Gait assistance: Min guard Gait Distance (Feet): 55 Feet Assistive device: Rolling walker (2 wheeled) Gait Pattern/deviations: Step-to pattern;Decreased stance time - right;Decreased stride length Gait velocity: decr   General Gait Details: VCs sequencing and positioning in RW  Stairs             Wheelchair Mobility    Modified Rankin (Stroke Patients Only)       Balance Overall balance assessment: Modified Independent                                           Pertinent Vitals/Pain Pain Assessment: 0-10 Pain Score: 4  Pain Descriptors / Indicators: Sore Pain Intervention(s): Limited activity within patient's tolerance;Monitored during session;Premedicated before session;Ice applied;Repositioned    Home Living Family/patient expects to be discharged to:: Private residence Living Arrangements: Spouse/significant other Available Help at Discharge: Family;Available 24 hours/day Type of Home: House Home Access: Stairs to enter   [employer] of Steps: 1 Home Layout: One level Home Equipment: Walker - 4 wheels      Prior Function Level of Independence: Independent               Hand Dominance        Extremity/Trunk Assessment   Upper Extremity Assessment Upper Extremity Assessment: Overall WFL for tasks assessed    Lower Extremity Assessment Lower Extremity Assessment: RLE deficits/detail RLE Deficits / Details: R knee ext 3/5, R hip 2/5 RLE Sensation: WNL    Cervical / Trunk Assessment Cervical / Trunk Assessment: Normal  Communication   Communication: No difficulties  Cognition Arousal/Alertness: Awake/alert Behavior During Therapy: WFL for tasks assessed/performed Overall Cognitive Status: Within Functional Limits for tasks assessed  General Comments      Exercises Total Joint Exercises Ankle Circles/Pumps: AROM;Right;10 reps;Supine Quad Sets: AROM;Right;5 reps;Supine Heel Slides: AAROM;Right;10 reps;Supine Hip ABduction/ADduction: AAROM;Right;10 reps;Supine Long Arc Quad: AROM;Right;5 reps;Seated   Assessment/Plan    PT Assessment Patient needs continued PT services  PT Problem List Decreased activity tolerance;Decreased strength;Decreased  mobility;Decreased knowledge of use of DME;Decreased knowledge of precautions;Decreased safety awareness       PT Treatment Interventions Gait training;Stair training;Therapeutic exercise;Therapeutic activities;DME instruction;Functional mobility training;Patient/family education    PT Goals (Current goals can be found in the Care Plan section)  Acute Rehab PT Goals Patient Stated Goal: gardening, pulling weeds PT Goal Formulation: With patient Time For Goal Achievement: 09/29/18 Potential to Achieve Goals: Good    Frequency 7X/week   Barriers to discharge        Co-evaluation               AM-PAC PT "6 Clicks" Mobility  Outcome Measure Help needed turning from your back to your side while in a flat bed without using bedrails?: A Little Help needed moving from lying on your back to sitting on the side of a flat bed without using bedrails?: A Little Help needed moving to and from a bed to a chair (including a wheelchair)?: A Little Help needed standing up from a chair using your arms (e.g., wheelchair or bedside chair)?: A Little Help needed to walk in hospital room?: A Little Help needed climbing 3-5 steps with a railing? : A Lot 6 Click Score: 17    End of Session Equipment Utilized During Treatment: Gait belt Activity Tolerance: Patient tolerated treatment well Patient left: in chair;with call bell/phone within reach;with chair alarm set Nurse Communication: Mobility status PT Visit Diagnosis: Difficulty in walking, not elsewhere classified (R26.2);Muscle weakness (generalized) (M62.81);Pain Pain - Right/Left: Right Pain - part of body: Hip    Time: 1610-9604 PT Time Calculation (min) (ACUTE ONLY): 36 min   Charges:   PT Evaluation $PT Eval Low Complexity: 1 Low PT Treatments $Gait Training: 8-22 mins       Blondell Reveal Kistler PT 09/22/2018  Acute Rehabilitation Services Pager [phone] Office [phone]

## 2018-09-22 NOTE — Op Note (Signed)
OPERATIVE REPORT- TOTAL HIP ARTHROPLASTY   PREOPERATIVE DIAGNOSIS: Osteoarthritis of the Right hip.   POSTOPERATIVE DIAGNOSIS: Osteoarthritis of the Right  hip.   PROCEDURE: Right total hip arthroplasty, anterior approach.   SURGEON: Gaynelle Arabian, MD   ASSISTANT: Griffith Citron, PA-C  ANESTHESIA:  Spinal  ESTIMATED BLOOD LOSS:-250 mL    DRAINS: Hemovac x1.   COMPLICATIONS: None   CONDITION: PACU - hemodynamically stable.   BRIEF CLINICAL NOTE: Krystal Swanson is a 68 y.o. female who has advanced end-  stage arthritis of their Right  hip with progressively worsening pain and  dysfunction.The patient has failed nonoperative management and presents for  total hip arthroplasty.   PROCEDURE IN DETAIL: After successful administration of spinal  anesthetic, the traction boots for the [facility] bed were placed on both  feet and the patient was placed onto the [facility] bed, boots placed into the leg  holders. The Right hip was then isolated from the perineum with plastic  drapes and prepped and draped in the usual sterile fashion. ASIS and  greater trochanter were marked and a oblique incision was made, starting  at about 1 cm lateral and 2 cm distal to the ASIS and coursing towards  the anterior cortex of the femur. The skin was cut with a 10 blade  through subcutaneous tissue to the level of the fascia overlying the  tensor fascia lata muscle. The fascia was then incised in line with the  incision at the junction of the anterior third and posterior 2/3rd. The  muscle was teased off the fascia and then the interval between the TFL  and the rectus was developed. The Hohmann retractor was then placed at  the top of the femoral neck over the capsule. The vessels overlying the  capsule were cauterized and the fat on top of the capsule was removed.  A Hohmann retractor was then placed anterior underneath the rectus  femoris to give exposure to the entire anterior capsule. A  T-shaped  capsulotomy was performed. The edges were tagged and the femoral head  was identified.       Osteophytes are removed off the superior acetabulum.  The femoral neck was then cut in situ with an oscillating saw. Traction  was then applied to the left lower extremity utilizing the [facility]  traction. The femoral head was then removed. Retractors were placed  around the acetabulum and then circumferential removal of the labrum was  performed. Osteophytes were also removed. Reaming starts at 45 mm to  medialize and  Increased in 2 mm increments to 47 mm. We reamed in  approximately 40 degrees of abduction, 20 degrees anteversion. A 48 mm  pinnacle acetabular shell was then impacted in anatomic position under  fluoroscopic guidance with excellent purchase. We did not need to place  any additional dome screws. A 28 mm neutral + 4 marathon liner was then  placed into the acetabular shell.       The femoral lift was then placed along the lateral aspect of the femur  just distal to the vastus ridge. The leg was  externally rotated and capsule  was stripped off the inferior aspect of the femoral neck down to the  level of the lesser trochanter, this was done with electrocautery. The femur was lifted after this was performed. The  leg was then placed in an extended and adducted position essentially delivering the femur. We also removed the capsule superiorly and the piriformis from the piriformis fossa  to gain excellent exposure of the  proximal femur. Rongeur was used to remove some cancellous bone to get  into the lateral portion of the proximal femur for placement of the  initial starter reamer. The starter broaches was placed  the starter broach  and was shown to go down the center of the canal. Broaching  with the Actis system was then performed starting at size 0  coursing  Up to size 4. A size 4 had excellent torsional and rotational  and axial stability. The trial standard offset neck was  then placed  with a 28 + 5 trial head. The hip was then reduced. We confirmed that  the stem was in the canal both on AP and lateral x-rays. It also has excellent sizing. The hip was reduced with outstanding stability through full extension and full external rotation.. AP pelvis was taken and the leg lengths were measured and found to be equal. Hip was then dislocated again and the femoral head and neck removed. The  femoral broach was removed. Size 4 Actis stem with a standard offset  neck was then impacted into the femur following native anteversion. Has  excellent purchase in the canal. Excellent torsional and rotational and  axial stability. It is confirmed to be in the canal on AP and lateral  fluoroscopic views. The 28 + 5 ceramic head was placed and the hip  reduced with outstanding stability. Again AP pelvis was taken and it  confirmed that the leg lengths were equal. The wound was then copiously  irrigated with saline solution and the capsule reattached and repaired  with Ethibond suture. 30 ml of .25% Bupivicaine was  injected into the capsule and into the edge of the tensor fascia lata as well as subcutaneous tissue. The fascia overlying the tensor fascia lata was then closed with a running #1 V-Loc. Subcu was closed with interrupted 2-0 Vicryl and subcuticular running 4-0 Monocryl. Incision was cleaned  and dried. Steri-Strips and a bulky sterile dressing applied. Hemovac  drain was hooked to suction and then the patient was awakened and transported to  recovery in stable condition.        Please note that a surgical assistant was a medical necessity for this procedure to perform it in a safe and expeditious manner. Assistant was necessary to provide appropriate retraction of vital neurovascular structures and to prevent femoral fracture and allow for anatomic placement of the prosthesis.  Gaynelle Arabian, M.D.

## 2018-09-22 NOTE — Transfer of Care (Signed)
Immediate Anesthesia Transfer of Care Note  Patient: Krystal Swanson  Procedure(s) Performed: TOTAL HIP ARTHROPLASTY ANTERIOR APPROACH (Right Hip)  Patient Location: PACU  Anesthesia Type:Spinal  Level of Consciousness: awake, alert  and patient cooperative  Airway & Oxygen Therapy: Patient Spontanous Breathing and Patient connected to face mask oxygen  Post-op Assessment: Report given to RN and Post -op Vital signs reviewed and stable  Post vital signs: Reviewed and stable  Last Vitals:  Vitals Value Taken Time  BP    Temp    Pulse 89 09/22/2018 11:54 AM  Resp 14 09/22/2018 11:54 AM  SpO2 100 % 09/22/2018 11:54 AM  Vitals shown include unvalidated device data.  Last Pain:  Vitals:   09/22/18 0907  TempSrc:   PainSc: 2       Patients Stated Pain Goal: 4 (78/93/81 0175)  Complications: No apparent anesthesia complications

## 2018-09-22 NOTE — Discharge Instructions (Addendum)
Dr. Gaynelle Arabian Total Joint Specialist [facility] [address] [place], [place] [address] [phone]  ANTERIOR APPROACH TOTAL HIP REPLACEMENT POSTOPERATIVE DIRECTIONS   Hip Rehabilitation, Guidelines Following Surgery  The results of a hip operation are greatly improved after range of motion and muscle strengthening exercises. Follow all safety measures which are given to protect your hip. If any of these exercises cause increased pain or swelling in your joint, decrease the amount until you are comfortable again. Then slowly increase the exercises. Call your caregiver if you have problems or questions.   POSTOPERATIVE INSTRUCTIONS PER ENDOCRINOLOGIST: On post-operative days #1-4 (Tuesday-Friday), double your morning and afternoon dose of hydrocortisone - 40 mg in the morning, 30 mg in the evening On Saturday, return to normal dosing of hydrocortisone  HOME CARE INSTRUCTIONS   Remove items at home which could result in a fall. This includes throw rugs or furniture in walking pathways.   ICE to the affected hip every three hours for 30 minutes at a time and then as needed for pain and swelling.  Continue to use ice on the hip for pain and swelling from surgery. You may notice swelling that will progress down to the foot and ankle.  This is normal after surgery.  Elevate the leg when you are not up walking on it.    Continue to use the breathing machine which will help keep your temperature down.  It is common for your temperature to cycle up and down following surgery, especially at night when you are not up moving around and exerting yourself.  The breathing machine keeps your lungs expanded and your temperature down.  DIET You may resume your previous home diet once your are discharged from the hospital.  DRESSING / WOUND CARE / SHOWERING You may change your dressing 3-5 days after surgery.  Then change the dressing every day with sterile gauze.  Please use good  hand washing techniques before changing the dressing.  Do not use any lotions or creams on the incision until instructed by your surgeon. You may start showering once you are discharged home but do not submerge the incision under water. Just pat the incision dry and apply a dry gauze dressing on daily. Change the surgical dressing daily and reapply a dry dressing each time.  ACTIVITY Walk with your walker as instructed. Use walker as long as suggested by your caregivers. Avoid periods of inactivity such as sitting longer than an hour when not asleep. This helps prevent blood clots.  You may resume a sexual relationship in one month or when given the OK by your doctor.  You may return to work once you are cleared by your doctor.  Do not drive a car for 6 weeks or until released by you surgeon.  Do not drive while taking narcotics.  WEIGHT BEARING Weight bearing as tolerated with assist device (walker, cane, etc) as directed, use it as long as suggested by your surgeon or therapist, typically at least 4-6 weeks.  POSTOPERATIVE CONSTIPATION PROTOCOL Constipation - defined medically as fewer than three stools per week and severe constipation as less than one stool per week.  One of the most common issues patients have following surgery is constipation.  Even if you have a regular bowel pattern at home, your normal regimen is likely to be disrupted due to multiple reasons following surgery.  Combination of anesthesia, postoperative narcotics, change in appetite and fluid intake all can affect your bowels.  In order to  avoid complications following surgery, here are some recommendations in order to help you during your recovery period.  Colace (docusate) - Pick up an over-the-counter form of Colace or another stool softener and take twice a day as long as you are requiring postoperative pain medications.  Take with a full glass of water daily.  If you experience loose stools or diarrhea, hold the  colace until you stool forms back up.  If your symptoms do not get better within 1 week or if they get worse, check with your doctor.  Dulcolax (bisacodyl) - Pick up over-the-counter and take as directed by the product packaging as needed to assist with the movement of your bowels.  Take with a full glass of water.  Use this product as needed if not relieved by Colace only.   MiraLax (polyethylene glycol) - Pick up over-the-counter to have on hand.  MiraLax is a solution that will increase the amount of water in your bowels to assist with bowel movements.  Take as directed and can mix with a glass of water, juice, soda, coffee, or tea.  Take if you go more than two days without a movement. Do not use MiraLax more than once per day. Call your doctor if you are still constipated or irregular after using this medication for 7 days in a row.  If you continue to have problems with postoperative constipation, please contact the office for further assistance and recommendations.  If you experience "the worst abdominal pain ever" or develop nausea or vomiting, please contact the office immediatly for further recommendations for treatment.  ITCHING  If you experience itching with your medications, try taking only a single pain pill, or even half a pain pill at a time.  You can also use Benadryl over the counter for itching or also to help with sleep.   TED HOSE STOCKINGS Wear the elastic stockings on both legs for three weeks following surgery during the day but you may remove then at night for sleeping.  MEDICATIONS See your medication summary on the After Visit Summary that the nursing staff will review with you prior to discharge.  You may have some home medications which will be placed on hold until you complete the course of blood thinner medication.  It is important for you to complete the blood thinner medication as prescribed by your surgeon.  Continue your approved medications as instructed at time  of discharge.  PRECAUTIONS If you experience chest pain or shortness of breath - call 911 immediately for transfer to the hospital emergency department.  If you develop a fever greater that 101 F, purulent drainage from wound, increased redness or drainage from wound, foul odor from the wound/dressing, or calf pain - CONTACT YOUR SURGEON.                                                   FOLLOW-UP APPOINTMENTS Make sure you keep all of your appointments after your operation with your surgeon and caregivers. You should call the office at the above phone number and make an appointment for approximately two weeks after the date of your surgery or on the date instructed by your surgeon outlined in the "After Visit Summary".  RANGE OF MOTION AND STRENGTHENING EXERCISES  These exercises are designed to help you keep full movement of your hip joint. Follow  your caregiver's or physical therapist's instructions. Perform all exercises about fifteen times, three times per day or as directed. Exercise both hips, even if you have had only one joint replacement. These exercises can be done on a training (exercise) mat, on the floor, on a table or on a bed. Use whatever works the best and is most comfortable for you. Use music or television while you are exercising so that the exercises are a pleasant break in your day. This will make your life better with the exercises acting as a break in routine you can look forward to.   Lying on your back, slowly slide your foot toward your buttocks, raising your knee up off the floor. Then slowly slide your foot back down until your leg is straight again.   Lying on your back spread your legs as far apart as you can without causing discomfort.   Lying on your side, raise your upper leg and foot straight up from the floor as far as is comfortable. Slowly lower the leg and repeat.   Lying on your back, tighten up the muscle in the front of your thigh (quadriceps muscles). You  can do this by keeping your leg straight and trying to raise your heel off the floor. This helps strengthen the largest muscle supporting your knee.   Lying on your back, tighten up the muscles of your buttocks both with the legs straight and with the knee bent at a comfortable angle while keeping your heel on the floor.   IF YOU ARE TRANSFERRED TO A SKILLED REHAB FACILITY If the patient is transferred to a skilled rehab facility following release from the hospital, a list of the current medications will be sent to the facility for the patient to continue.  When discharged from the skilled rehab facility, please have the facility set up the patient's [place] prior to being released. Also, the skilled facility will be responsible for providing the patient with their medications at time of release from the facility to include their pain medication, the muscle relaxants, and their blood thinner medication. If the patient is still at the rehab facility at time of the two week follow up appointment, the skilled rehab facility will also need to assist the patient in arranging follow up appointment in our office and any transportation needs.  MAKE SURE YOU:   Understand these instructions.   Get help right away if you are not doing well or get worse.    Pick up stool softner and laxative for home use following surgery while on pain medications. Do not submerge incision under water. Please use good hand washing techniques while changing dressing each day. May shower starting three days after surgery. Please use a clean towel to pat the incision dry following showers. Continue to use ice for pain and swelling after surgery. Do not use any lotions or creams on the incision until instructed by your surgeon.

## 2018-09-22 NOTE — Anesthesia Procedure Notes (Addendum)
Procedure Name: MAC Date/Time: 09/22/2018 9:40 AM Performed by: Lollie Sails, CRNA Pre-anesthesia Checklist: Patient identified, Emergency Drugs available, Suction available and Patient being monitored Oxygen Delivery Method: Simple face mask

## 2018-09-22 NOTE — Interval H&P Note (Signed)
History and Physical Interval Note:  09/22/2018 8:41 AM  Krystal Swanson  has presented today for surgery, with the diagnosis of right hip osteoarthritis.  The various methods of treatment have been discussed with the patient and family. After consideration of risks, benefits and other options for treatment, the patient has consented to  Procedure(s): TOTAL HIP ARTHROPLASTY ANTERIOR APPROACH (Right) as a surgical intervention.  The patient's history has been reviewed, patient examined, no change in status, stable for surgery.  I have reviewed the patient's chart and labs.  Questions were answered to the patient's satisfaction.     Krystal Swanson

## 2018-09-22 NOTE — Anesthesia Procedure Notes (Signed)
Spinal  Patient location during procedure: OR Start time: 09/22/2018 9:52 AM End time: 09/22/2018 10:00 AM Staffing Anesthesiologist: Barnet Glasgow, MD Performed: anesthesiologist  Preanesthetic Checklist Completed: patient identified, site marked, surgical consent, pre-op evaluation, timeout performed, IV checked, risks and benefits discussed and monitors and equipment checked Spinal Block Patient position: sitting Prep: DuraPrep Patient monitoring: heart rate, cardiac monitor, continuous pulse ox and blood pressure Approach: midline Location: L3-4 Injection technique: single-shot Needle Needle type: Sprotte  Needle gauge: 24 G Needle length: 9 cm Needle insertion depth: 7 cm Assessment Sensory level: T4 Additional Notes 3 Attempts. Pt tolerated procedure well.

## 2018-09-22 NOTE — Plan of Care (Signed)
  Problem: Education: Goal: Knowledge of General Education information will improve Description Including pain rating scale, medication(s)/side effects and non-pharmacologic comfort measures Outcome: Progressing   Problem: Health Behavior/Discharge Planning: Goal: Ability to manage health-related needs will improve Outcome: Progressing   Problem: Activity: Goal: Risk for activity intolerance will decrease Outcome: Progressing   Problem: Coping: Goal: Level of anxiety will decrease Outcome: Progressing   Problem: Pain Managment: Goal: General experience of comfort will improve Outcome: Progressing   Problem: Pain Management: Goal: Pain level will decrease with appropriate interventions Outcome: Progressing

## 2018-09-22 NOTE — Anesthesia Postprocedure Evaluation (Signed)
Anesthesia Post Note  Patient: [name]  Procedure(s) Performed: TOTAL HIP ARTHROPLASTY ANTERIOR APPROACH (Right Hip)     Patient location during evaluation: Nursing Unit Anesthesia Type: Spinal Level of consciousness: oriented and awake and alert Pain management: pain level controlled Vital Signs Assessment: post-procedure vital signs reviewed and stable Respiratory status: spontaneous breathing and respiratory function stable Cardiovascular status: blood pressure returned to baseline and stable Postop Assessment: no headache, no backache, no apparent nausea or vomiting and patient able to bend at knees Anesthetic complications: no    Last Vitals:  Vitals:   09/22/18 1300 09/22/18 1315  BP: (!) 147/88 (!) 149/75  Pulse: 95 100  Resp: 16 14  Temp:    SpO2: 98% 97%    Last Pain:  Vitals:   09/22/18 1300  TempSrc:   PainSc: 4                  Barnet Glasgow

## 2018-09-23 ENCOUNTER — Encounter (HOSPITAL_COMMUNITY): Payer: Self-pay | Admitting: Orthopedic Surgery

## 2018-09-23 LAB — BASIC METABOLIC PANEL
Anion gap: 10 (ref 5–15)
Anion gap: 6 (ref 5–15)
BUN: 12 mg/dL (ref 8–23)
BUN: 13 mg/dL (ref 8–23)
CO2: 26 mmol/L (ref 22–32)
CO2: 28 mmol/L (ref 22–32)
Calcium: 8.5 mg/dL — ABNORMAL LOW (ref 8.9–10.3)
Calcium: 8.4 mg/dL — ABNORMAL LOW (ref 8.9–10.3)
Chloride: 104 mmol/L (ref 98–111)
Chloride: 107 mmol/L (ref 98–111)
Creatinine, Ser: 0.89 mg/dL (ref 0.44–1.00)
Creatinine, Ser: 0.97 mg/dL (ref 0.44–1.00)
GFR calc Af Amer: >60 mL/min (ref >60)
GFR calc Af Amer: >60 mL/min (ref >60)
GFR calc non Af Amer: >60 mL/min (ref >60)
GFR calc non Af Amer: >60 mL/min (ref >60)
Glucose, Bld: 153 mg/dL — ABNORMAL HIGH (ref 70–99)
Glucose, Bld: 162 mg/dL — ABNORMAL HIGH (ref 70–99)
Potassium: 3 mmol/L — ABNORMAL LOW (ref 3.5–5.1)
Potassium: 3.8 mmol/L (ref 3.5–5.1)
Sodium: 140 mmol/L (ref 135–145)
Sodium: 141 mmol/L (ref 135–145)

## 2018-09-23 LAB — CBC
HCT: 40 % (ref 36.0–46.0)
Hemoglobin: 13 g/dL (ref 12.0–15.0)
MCH: 33.2 pg (ref 26.0–34.0)
MCHC: 32.5 g/dL (ref 30.0–36.0)
MCV: 102.3 fL — ABNORMAL HIGH (ref 80.0–100.0)
Platelets: 190 10*3/uL (ref 150–400)
RBC: 3.91 MIL/uL (ref 3.87–5.11)
RDW: 13 % (ref 11.5–15.5)
WBC: 12.7 10*3/uL — ABNORMAL HIGH (ref 4.0–10.5)
nRBC: 0 % (ref 0.0–0.2)

## 2018-09-23 MED ORDER — ASPIRIN 325 MG PO TBEC: 325.0000 mg | DELAYED_RELEASE_TABLET | Freq: Two times a day (BID) | ORAL | 0 refills | Status: AC

## 2018-09-23 MED ORDER — HYDROCORTISONE 20 MG PO TABS
40.0000 mg | ORAL_TABLET | Freq: Every day | ORAL | Status: DC
  Administered 2018-09-23: 40 mg via ORAL
  Filled 2018-09-23: qty 2

## 2018-09-23 MED ORDER — HYDROCORTISONE 20 MG PO TABS
30.0000 mg | ORAL_TABLET | Freq: Every evening | ORAL | Status: DC
  Administered 2018-09-23: 30 mg via ORAL
  Filled 2018-09-23: qty 1

## 2018-09-23 MED ORDER — HYDROCODONE-ACETAMINOPHEN 5-325 MG PO TABS: 1.0000 | ORAL_TABLET | Freq: Four times a day (QID) | ORAL | 0 refills | Status: DC | PRN

## 2018-09-23 MED ORDER — METHOCARBAMOL 500 MG PO TABS: 500.0000 mg | ORAL_TABLET | Freq: Four times a day (QID) | ORAL | 0 refills | Status: DC | PRN

## 2018-09-23 MED ORDER — SODIUM CHLORIDE 0.9 % IV BOLUS
250.0000 mL | Freq: Once | INTRAVENOUS | Status: AC
  Administered 2018-09-23: 250 mL via INTRAVENOUS

## 2018-09-23 MED ORDER — POTASSIUM CHLORIDE CRYS ER 20 MEQ PO TBCR
40.0000 meq | EXTENDED_RELEASE_TABLET | Freq: Two times a day (BID) | ORAL | Status: AC
  Administered 2018-09-23 (×2): 40 meq via ORAL
  Filled 2018-09-23: qty 2

## 2018-09-23 NOTE — Progress Notes (Signed)
Physical Therapy Treatment Patient Details Name: Krystal Swanson MRN: [id number] DOB: 1951-01-12 Today's Date: 09/23/2018    History of Present Illness R THR DA    PT Comments    POD # 1 pm session Assisted OOB. Demonstrated and instructed pt how to use belt to self assist LE as a leg lifter Assisted to bathroom.  Assisted with amb in hallway an increased distance.  Then returned to room to perform some TE's following HEP handout.  Instructed on proper tech, freq as well as use of ICE.   Addressed all mobility questions, discussed appropriate activity, educated on use of ICE.  Pt ready for D/C to home.   Follow Up Recommendations  Follow surgeon's recommendation for DC plan and follow-up therapies     Equipment Recommendations  Rolling walker with 5" wheels    Recommendations for Other Services       Precautions / Restrictions Precautions Precautions: Fall Precaution Comments: pt denies h/o falls Restrictions Weight Bearing Restrictions: No Other Position/Activity Restrictions: WBAT    Mobility  Bed Mobility Overal bed mobility: Needs Assistance Bed Mobility: Sit to Supine;Supine to Sit     Supine to sit: Min guard;Supervision Sit to supine: Supervision;Min guard   General bed mobility comments: demonstared and instructed how to use a belt to self assist LE up onto bed   pt able   Transfers Overall transfer level: Needs assistance Equipment used: Rolling walker (2 wheeled) Transfers: Sit to/from [facility] Sit to Stand: Min guard Stand pivot transfers: Min assist       General transfer comment: VCs hand placement, min/guard for safety  assisted with toilet transfer   Ambulation/Gait Ambulation/Gait assistance: Min guard Gait Distance (Feet): 75 Feet Assistive device: Rolling walker (2 wheeled) Gait Pattern/deviations: Step-to pattern;Decreased stance time - right;Decreased stride length Gait velocity: decr   General Gait Details: VCs  sequencing and positioning in RW  mild unsteady.  VC's to NOT "tip toe" walk   tolerated an increased distance    Stairs Stairs: (o stairs to enter home)           Wheelchair Mobility    Modified Rankin (Stroke Patients Only)       Balance                                            Cognition Arousal/Alertness: Awake/alert Behavior During Therapy: WFL for tasks assessed/performed Overall Cognitive Status: Within Functional Limits for tasks assessed                                 General Comments: slightly "foggy" required repeat instructions       Exercises   Total Hip Replacement TE's 10 reps ankle pumps 10 reps knee presses 10 reps heel slides 10 reps SAQ's 10 reps ABD Followed by ICE     General Comments        Pertinent Vitals/Pain Pain Assessment: 0-10 Pain Score: 5  Pain Location: R hip Pain Descriptors / Indicators: Sore;Operative site guarding;Tender Pain Intervention(s): Monitored during session;Repositioned;Premedicated before session;Ice applied    Home Living                      Prior Function            PT Goals (current goals can now be  found in the care plan section) Progress towards PT goals: Progressing toward goals    Frequency    7X/week      PT Plan Current plan remains appropriate    Co-evaluation              AM-PAC PT "6 Clicks" Mobility   Outcome Measure  Help needed turning from your back to your side while in a flat bed without using bedrails?: A Little Help needed moving from lying on your back to sitting on the side of a flat bed without using bedrails?: A Little Help needed moving to and from a bed to a chair (including a wheelchair)?: A Little Help needed standing up from a chair using your arms (e.g., wheelchair or bedside chair)?: A Little Help needed to walk in hospital room?: A Little Help needed climbing 3-5 steps with a railing? : A Lot 6 Click Score:  17    End of Session Equipment Utilized During Treatment: Gait belt Activity Tolerance: Patient tolerated treatment well Patient left: in bed;with bed alarm set;with call bell/phone within reach Nurse Communication: (pt ready for D/C to home ) PT Visit Diagnosis: Difficulty in walking, not elsewhere classified (R26.2);Muscle weakness (generalized) (M62.81);Pain Pain - Right/Left: Right Pain - part of body: Hip     Time: 1510-1550 PT Time Calculation (min) (ACUTE ONLY): 40 min  Charges:  $Gait Training: 8-22 mins $Therapeutic Exercise: 8-22 mins $Therapeutic Activity: 8-22 mins                     Rica Koyanagi  PTA Acute  Rehabilitation Services Pager      [phone] Office      [phone]

## 2018-09-23 NOTE — Plan of Care (Signed)

## 2018-09-23 NOTE — Progress Notes (Signed)
Physical Therapy Treatment Patient Details Name: Krystal Swanson MRN: [id number] DOB: 1950-05-30 Today's Date: 09/23/2018    History of Present Illness R THR DA    PT Comments    POD # 1 am session Assisted out of recliner to bathroom.  Assisted with toilet transfer with VC's safety.  Assisted with amb in hallway.  General Gait Details: VCs sequencing and positioning in RW  mild unsteady.  VC's to NOT "tip toe" walk.  Assisted back to bed.  Demonstrated and instructed pt how to use belt to self assist LE as a leg lifter Pt requesting a nap then D/C to home later today  Follow Up Recommendations  Follow surgeon's recommendation for DC plan and follow-up therapies     Equipment Recommendations  Rolling walker with 5" wheels    Recommendations for Other Services       Precautions / Restrictions Precautions Precautions: Fall Restrictions Weight Bearing Restrictions: No Other Position/Activity Restrictions: WBAT    Mobility  Bed Mobility Overal bed mobility: Needs Assistance Bed Mobility: Sit to Supine       Sit to supine: Min assist   General bed mobility comments: demonstared and instructed how to use a belt to self assist LE up onto bed   Transfers Overall transfer level: Needs assistance Equipment used: Rolling walker (2 wheeled) Transfers: Sit to/from [facility] Sit to Stand: Min guard Stand pivot transfers: Min assist       General transfer comment: VCs hand placement, min/guard for safety  assisted with toilet transfer   Ambulation/Gait Ambulation/Gait assistance: Min guard Gait Distance (Feet): 60 Feet Assistive device: Rolling walker (2 wheeled) Gait Pattern/deviations: Step-to pattern;Decreased stance time - right;Decreased stride length Gait velocity: decr   General Gait Details: VCs sequencing and positioning in RW  mild unsteady.  VC's to NOT "tip toe" walk   Stairs             Wheelchair Mobility    Modified  Rankin (Stroke Patients Only)       Balance                                            Cognition Arousal/Alertness: Awake/alert Behavior During Therapy: WFL for tasks assessed/performed Overall Cognitive Status: Within Functional Limits for tasks assessed                                 General Comments: slightly "foggy" required repeat instructions       Exercises      General Comments        Pertinent Vitals/Pain Pain Assessment: 0-10 Pain Score: 5  Pain Location: R hip Pain Descriptors / Indicators: Sore;Operative site guarding;Tender Pain Intervention(s): Monitored during session;Repositioned;Premedicated before session;Ice applied    Home Living                      Prior Function            PT Goals (current goals can now be found in the care plan section) Progress towards PT goals: Progressing toward goals    Frequency    7X/week      PT Plan Current plan remains appropriate    Co-evaluation              AM-PAC PT "6 Clicks" Mobility  Outcome Measure  Help needed turning from your back to your side while in a flat bed without using bedrails?: A Little Help needed moving from lying on your back to sitting on the side of a flat bed without using bedrails?: A Little Help needed moving to and from a bed to a chair (including a wheelchair)?: A Little Help needed standing up from a chair using your arms (e.g., wheelchair or bedside chair)?: A Little Help needed to walk in hospital room?: A Little Help needed climbing 3-5 steps with a railing? : A Lot 6 Click Score: 17    End of Session Equipment Utilized During Treatment: Gait belt Activity Tolerance: Patient tolerated treatment well Patient left: in bed;with bed alarm set;with call bell/phone within reach Nurse Communication: Mobility status(pt requesting a nap ) PT Visit Diagnosis: Difficulty in walking, not elsewhere classified (R26.2);Muscle  weakness (generalized) (M62.81);Pain Pain - Right/Left: Right Pain - part of body: Hip     Time: 1152-1226 PT Time Calculation (min) (ACUTE ONLY): 34 min  Charges:  $Gait Training: 8-22 mins $Therapeutic Activity: 8-22 mins                     Rica Koyanagi  PTA Acute  Rehabilitation Services Pager      [phone] Office      [phone]

## 2018-09-23 NOTE — Progress Notes (Signed)
   Subjective: 1 Day Post-Op Procedure(s) (LRB): TOTAL HIP ARTHROPLASTY ANTERIOR APPROACH (Right) Patient reports pain as mild.   Patient seen in rounds by Dr. Wynelle Link. Patient states she is not feeling well this AM, but has no specific complaints or problems. Bed was uncomfortable, she is resting in the recliner upon rounds. No issues overnight. Denies chest pain or SOB. Foley catheter removed this AM.  We will continue therapy today.   Objective: Vital signs in last 24 hours: Temp:  [97.5 F (36.4 C)-98.6 F (37 C)] 97.6 F (36.4 C) (05/19 6967) Pulse Rate:  [66-102] 68 (05/19 0632) Resp:  [13-20] 16 (05/19 0127) BP: (88-162)/(66-97) 103/68 (05/19 0632) SpO2:  [95 %-100 %] 96 % (05/19 0629) Weight:  [60.6 kg] 60.6 kg (05/18 0907)  Intake/Output from previous day:  Intake/Output Summary (Last 24 hours) at 09/23/2018 0718 Last data filed at 09/23/2018 8938 Gross per 24 hour  Intake 4059.44 ml  Output 4775 ml  Net -715.56 ml     Intake/Output this shift: No intake/output data recorded.  Labs: Recent Labs    09/23/18 0618  HGB 13.0   Recent Labs    09/23/18 0618  WBC 12.7*  RBC 3.91  HCT 40.0  PLT 190   Recent Labs    09/23/18 0618  NA 140  K 3.0*  CL 104  CO2 26  BUN 12  CREATININE 0.89  GLUCOSE 153*  CALCIUM 8.5*   No results for input(s): LABPT, INR in the last 72 hours.  Exam: General - Patient is Alert and Oriented Extremity - Neurologically intact Neurovascular intact Sensation intact distally Dorsiflexion/Plantar flexion intact Dressing - dressing C/D/I Motor Function - intact, moving foot and toes well on exam.   Past Medical History:  Diagnosis Date  . 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 Anxiety   . Arthritis   . Depression   . Diverticulosis 2019   Noted on colonoscopy  . GERD (gastroesophageal reflux disease)   . History of colon polyps   . Hyperlipidemia   . Hypothyroidism   . Osteoporosis   . Pneumonia   . Seasonal allergies   .  Vitamin D deficiency     Assessment/Plan: 1 Day Post-Op Procedure(s) (LRB): TOTAL HIP ARTHROPLASTY ANTERIOR APPROACH (Right) Principal Problem:   OA (osteoarthritis) of hip  Estimated body mass index is 26.96 kg/m as calculated from the following:   Height as of this encounter: 4\' 11"  (1.499 m).   Weight as of this encounter: 60.6 kg. Advance diet Up with therapy D/C IV fluids  DVT Prophylaxis - Aspirin Weight bearing as tolerated. D/C O2 and pulse ox and try on room air. Hemovac pulled without difficulty, will continue therapy.  BP soft this AM, 250 mL bolus ordered. Potassium dropped from 3.6 pre-operatively to 3.0 this AM, two doses of 40 mEq KCl ordered. Plan is to go Home after hospital stay. Possible discharge this afternoon if patient is feeling well and meeting goals with physical therapy. Follow-up in the office in 2 weeks.   Discharge Instructions for Addison's/Hydrocortisone per endocrinologist: POD #1-4: double AM and PM dose of hydrocortisone (40 mg AM, 30 mg PM) Then resume normal dosing for hydrocortisone.  Theresa Duty, PA-C Orthopedic Surgery 09/23/2018, 7:18 AM

## 2018-10-06 NOTE — Discharge Summary (Signed)
Physician Discharge Summary   Patient ID: Krystal Swanson MRN: [id number] DOB/AGE: April 09, 1951 68 y.o.  Admit date: 09/22/2018 Discharge date: 09/23/2018  Primary Diagnosis: Osteoarthritis, right hip   Admission Diagnoses:  Past Medical History:  Diagnosis Date  . Addison's disease ([place])   . Anxiety   . Arthritis   . Depression   . Diverticulosis 2019   Noted on colonoscopy  . GERD (gastroesophageal reflux disease)   . History of colon polyps   . Hyperlipidemia   . Hypothyroidism   . Osteoporosis   . Pneumonia   . Seasonal allergies   . Vitamin D deficiency    Discharge Diagnoses:   Principal Problem:   OA (osteoarthritis) of hip  Estimated body mass index is 26.96 kg/m as calculated from the following:   Height as of this encounter: 4\' 11"  (1.499 m).   Weight as of this encounter: 60.6 kg.  Procedure:  Procedure(s) (LRB): TOTAL HIP ARTHROPLASTY ANTERIOR APPROACH (Right)   Consults: None  HPI: Krystal Swanson is a 68 y.o. female who has advanced end-stage arthritis of their Right  hip with progressively worsening pain and dysfunction.The patient has failed nonoperative management and presents for total hip arthroplasty.   Laboratory Data: Admission on 09/22/2018, Discharged on 09/23/2018  Component Date Value Ref Range Status  . WBC 09/23/2018 12.7* 4.0 - 10.5 K/uL Final  . RBC 09/23/2018 3.91  3.87 - 5.11 MIL/uL Final  . Hemoglobin 09/23/2018 13.0  12.0 - 15.0 g/dL Final  . HCT 09/23/2018 40.0  36.0 - 46.0 % Final  . MCV 09/23/2018 102.3* 80.0 - 100.0 fL Final  . MCH 09/23/2018 33.2  26.0 - 34.0 pg Final  . MCHC 09/23/2018 32.5  30.0 - 36.0 g/dL Final  . RDW 09/23/2018 13.0  11.5 - 15.5 % Final  . Platelets 09/23/2018 190  150 - 400 K/uL Final  . nRBC 09/23/2018 0.0  0.0 - 0.2 % Final   Performed at [facility], [place] [address], [place], [place] [address]  . Sodium 09/23/2018 140  135 - 145 mmol/L Final  . Potassium 09/23/2018 3.0*  3.5 - 5.1 mmol/L Final  . Chloride 09/23/2018 104  98 - 111 mmol/L Final  . CO2 09/23/2018 26  22 - 32 mmol/L Final  . Glucose, Bld 09/23/2018 153* 70 - 99 mg/dL Final  . BUN 09/23/2018 12  8 - 23 mg/dL Final  . Creatinine, Ser 09/23/2018 0.89  0.44 - 1.00 mg/dL Final  . Calcium 09/23/2018 8.5* 8.9 - 10.3 mg/dL Final  . GFR calc non Af Amer 09/23/2018 >60  >60 mL/min Final  . GFR calc Af Amer 09/23/2018 >60  >60 mL/min Final  . Anion gap 09/23/2018 10  5 - 15 Final   Performed at [facility], [place] [address], [place], [place] [address]  . Sodium 09/23/2018 141  135 - 145 mmol/L Final  . Potassium 09/23/2018 3.8  3.5 - 5.1 mmol/L Final   Comment: DELTA CHECK NOTED REPEATED TO VERIFY NO VISIBLE HEMOLYSIS   . Chloride 09/23/2018 107  98 - 111 mmol/L Final  . CO2 09/23/2018 28  22 - 32 mmol/L Final  . Glucose, Bld 09/23/2018 162* 70 - 99 mg/dL Final  . BUN 09/23/2018 13  8 - 23 mg/dL Final  . Creatinine, Ser 09/23/2018 0.97  0.44 - 1.00 mg/dL Final  . Calcium 09/23/2018 8.4* 8.9 - 10.3 mg/dL Final  . GFR calc non Af Amer 09/23/2018 >60  >60 mL/min Final  .  GFR calc Af Amer 09/23/2018 >60  >60 mL/min Final  . Anion gap 09/23/2018 6  5 - 15 Final   Performed at [facility], [place] [address], [place], [place] [address]  Hospital Outpatient Visit on 09/18/2018  Component Date Value Ref Range Status  . SARS-CoV-2, NAA 09/18/2018 NOT DETECTED  NOT DETECTED Final   Comment: (NOTE) This test was developed and its performance characteristics determined by [employer]. This test has not been FDA cleared or approved. This test has been authorized by FDA under an Emergency Use Authorization (EUA). This test is only authorized for the duration of time the declaration that circumstances exist justifying the authorization of the emergency use of in vitro diagnostic tests for detection of SARS-CoV-2 virus and/or diagnosis of COVID-19 infection under  section 564(b)(1) of the Act, 21 U.S.C. 284XLK-4(M)(0), unless the authorization is terminated or revoked sooner. When diagnostic testing is negative, the possibility of a false negative result should be considered in the context of a patient's recent exposures and the presence of clinical signs and symptoms consistent with COVID-19. An individual without symptoms of COVID-19 and who is not shedding SARS-CoV-2 virus would expect to have a negative (not detected) result in this assay. Performed                           At: [facility] [place], [place] [id number] Rush Farmer MD [id number]   . Coronavirus Source 09/18/2018 NASOPHARYNGEAL   Final   Performed at [facility], [place] [address] [place], [place] [address]  Hospital Outpatient Visit on 09/16/2018  Component Date Value Ref Range Status  . aPTT 09/16/2018 22* 24 - 36 seconds Final   Performed at [facility], [place] [address] [place],  [address]  . WBC 09/16/2018 7.9  4.0 - 10.5 K/uL Final  . RBC 09/16/2018 4.99  3.87 - 5.11 MIL/uL Final  . Hemoglobin 09/16/2018 16.0* 12.0 - 15.0 g/dL Final  . HCT 09/16/2018 49.8* 36.0 - 46.0 % Final  . MCV 09/16/2018 99.8  80.0 - 100.0 fL Final  . MCH 09/16/2018 32.1  26.0 - 34.0 pg Final  . MCHC 09/16/2018 32.1  30.0 - 36.0 g/dL Final  . RDW 09/16/2018 13.0  11.5 - 15.5 % Final  . Platelets 09/16/2018 250  150 - 400 K/uL Final  . nRBC 09/16/2018 0.0  0.0 - 0.2 % Final   Performed at [facility], [place] [address] [place],  [address]  . Sodium 09/16/2018 141  135 - 145 mmol/L Final  . Potassium 09/16/2018 3.6  3.5 - 5.1 mmol/L Final  . Chloride 09/16/2018 104  98 - 111 mmol/L Final  . CO2 09/16/2018 29  22 - 32 mmol/L Final  . Glucose, Bld 09/16/2018 123* 70 - 99 mg/dL Final  . BUN 09/16/2018 15  8 - 23 mg/dL Final  . Creatinine, Ser 09/16/2018 0.81  0.44 - 1.00 mg/dL Final  . Calcium 09/16/2018 9.3  8.9  - 10.3 mg/dL Final  . Total Protein 09/16/2018 6.7  6.5 - 8.1 g/dL Final  . Albumin 09/16/2018 4.2  3.5 - 5.0 g/dL Final  . AST 09/16/2018 19  15 - 41 U/L Final  . ALT 09/16/2018 34  0 - 44 U/L Final  . Alkaline Phosphatase 09/16/2018 45  38 - 126 U/L Final  . Total Bilirubin 09/16/2018 0.7  0.3 - 1.2 mg/dL Final  .  GFR calc non Af Amer 09/16/2018 >60  >60 mL/min Final  . GFR calc Af Amer 09/16/2018 >60  >60 mL/min Final  . Anion gap 09/16/2018 8  5 - 15 Final   Performed at [facility], [place] [address], [place], [place] [address]  . Prothrombin Time 09/16/2018 12.5  11.4 - 15.2 seconds Final  . INR 09/16/2018 0.9  0.8 - 1.2 Final   Comment: (NOTE) INR goal varies based on device and disease states. Performed at [facility], [place] [address], [place], [place] [address]   . ABO/RH(D) 09/16/2018 A POS   Final  . Antibody Screen 09/16/2018 NEG   Final  . Sample Expiration 09/16/2018 09/25/2018,2359   Final  . Extend sample reason 09/16/2018    Final                   Value:NO TRANSFUSIONS OR PREGNANCY IN THE PAST 3 MONTHS Performed at [place] [address], [place], [place] [address]   . MRSA, PCR 09/16/2018 NEGATIVE  NEGATIVE Final  . Staphylococcus aureus 09/16/2018 NEGATIVE  NEGATIVE Final   Comment: (NOTE) The Xpert SA Assay (FDA approved for NASAL specimens in patients 32 years of age and older), is one component of a comprehensive surveillance program. It is not intended to diagnose infection nor to guide or monitor treatment. Performed at [facility], [place] [address] [place], [place] [address]   . ABO/RH(D) 09/16/2018    Final                   Value:A POS Performed at [facility], [place] [address], [place], [place] [address]      X-Rays:Dg Pelvis Portable  Result Date: 09/22/2018 CLINICAL DATA:  Status post right hip replacement EXAM: PORTABLE PELVIS 1-2 VIEWS  COMPARISON:  Intraoperative films from earlier in the same day. FINDINGS: Right hip replacement is noted in place. Surgical drain is seen. No soft tissue or bony abnormality is noted. IMPRESSION: Status post right hip replacement. Electronically Signed   By: Inez Catalina M.D.   On: 09/22/2018 13:20   Dg C-arm 1-60 Min-no Report  Result Date: 09/22/2018 Fluoroscopy was utilized by the requesting physician.  No radiographic interpretation.   Dg Hip Operative Unilat W Or W/o Pelvis Right  Result Date: 09/22/2018 CLINICAL DATA:  Intraoperative right anterior hip replacement EXAM: OPERATIVE RIGHT HIP (WITH PELVIS IF PERFORMED) 2 VIEWS TECHNIQUE: Fluoroscopic spot image(s) were submitted for interpretation post-operatively. FLUOROSCOPY TIME:  8 seconds COMPARISON:  None. FINDINGS: Intraoperative fluoroscopic images during right hip replacement. Right hip arthroplasty in satisfactory position. IMPRESSION: Intraoperative fluoroscopic images during right hip replacement, as above. Electronically Signed   By: Julian Hy M.D.   On: 09/22/2018 11:21    EKG:No orders found for this or any previous visit.   Hospital Course: Krystal Swanson is a 68 y.o. who was admitted to [facility]. They were brought to the operating room on 09/22/2018 and underwent Procedure(s): [place].  Patient tolerated the procedure well and was later transferred to the recovery room and then to the orthopaedic floor for postoperative care. They were given PO and IV analgesics for pain control following their surgery. They were given 24 hours of postoperative antibiotics of  Anti-infectives (From admission, onward)   Start     Dose/Rate Route Frequency Ordered Stop   09/22/18 1600  ceFAZolin (ANCEF) IVPB 1 g/50 mL premix  1 g 100 mL/hr over 30 Minutes Intravenous Every 6 hours 09/22/18 1417 09/22/18 2148   09/22/18 0830  ceFAZolin (ANCEF) IVPB 2g/100 mL premix     2 g 200 mL/hr  over 30 Minutes Intravenous On call to O.R. 09/22/18 0825 09/22/18 1000     and started on DVT prophylaxis in the form of Aspirin.   PT and OT were ordered for total joint protocol. Discharge planning consulted to help with postop disposition and equipment needs.  Patient had a decent night on the evening of surgery. They started to get up OOB with therapy on POD #0. Pt was seen during rounds and was ready to go home pending progress with therapy. BP was soft that AM, 250 mL bolus ordered with improvement. Potassium dropped from 3.6 pre-operatively to 3.0, two doses of 40 mEq KCl were ordered. Hemovac drain was pulled without difficulty. She worked with therapy on POD #1 and was meeting her goals. Pt was discharged to home later that day in stable condition.  Discharge Instructions for Addison's/Hydrocortisone per endocrinologist: POD #1-4: double AM and PM dose of hydrocortisone (40 mg AM, 30 mg PM) Then resume normal dosing for hydrocortisone.  Diet: Regular diet Activity: WBAT Follow-up: in 2 weeks Disposition: Home with HEP Discharged Condition: stable   Discharge Instructions    Call MD / Call 911   Complete by:  As directed    If you experience chest pain or shortness of breath, CALL 911 and be transported to the hospital emergency room.  If you develope a fever above 101 F, pus (white drainage) or increased drainage or redness at the wound, or calf pain, call your surgeon's office.   Change dressing   Complete by:  As directed    You may change your dressing on Wednesday, then change the dressing daily with sterile 4 x 4 inch gauze dressing and paper tape.   Constipation Prevention   Complete by:  As directed    Drink plenty of fluids.  Prune juice may be helpful.  You may use a stool softener, such as Colace (over the counter) 100 mg twice a day.  Use MiraLax (over the counter) for constipation as needed.   Diet - low sodium heart healthy   Complete by:  As directed    Discharge  instructions   Complete by:  As directed    Dr. Gaynelle Arabian Total Joint Specialist [facility] [address], [place], [place] [address] [phone]  ANTERIOR APPROACH TOTAL HIP REPLACEMENT POSTOPERATIVE DIRECTIONS   Hip Rehabilitation, Guidelines Following Surgery  The results of a hip operation are greatly improved after range of motion and muscle strengthening exercises. Follow all safety measures which are given to protect your hip. If any of these exercises cause increased pain or swelling in your joint, decrease the amount until you are comfortable again. Then slowly increase the exercises. Call your caregiver if you have problems or questions.   HOME CARE INSTRUCTIONS  Remove items at home which could result in a fall. This includes throw rugs or furniture in walking pathways.  ICE to the affected hip every three hours for 30 minutes at a time and then as needed for pain and swelling.  Continue to use ice on the hip for pain and swelling from surgery. You may notice swelling that will progress down to the foot and ankle.  This is normal after surgery.  Elevate the leg when you are not up walking on it.  Continue to use the breathing machine which will help keep your temperature down.  It is common for your temperature to cycle up and down following surgery, especially at night when you are not up moving around and exerting yourself.  The breathing machine keeps your lungs expanded and your temperature down.  DIET You may resume your previous home diet once your are discharged from the hospital.  DRESSING / WOUND CARE / SHOWERING You may change your dressing 3-5 days after surgery.  Then change the dressing every day with sterile gauze.  Please use good hand washing techniques before changing the dressing.  Do not use any lotions or creams on the incision until instructed by your surgeon. You may start showering once you are discharged home but do not submerge the  incision under water. Just pat the incision dry and apply a dry gauze dressing on daily. Change the surgical dressing daily and reapply a dry dressing each time.  ACTIVITY Walk with your walker as instructed. Use walker as long as suggested by your caregivers. Avoid periods of inactivity such as sitting longer than an hour when not asleep. This helps prevent blood clots.  You may resume a sexual relationship in one month or when given the OK by your doctor.  You may return to work once you are cleared by your doctor.  Do not drive a car for 6 weeks or until released by you surgeon.  Do not drive while taking narcotics.  WEIGHT BEARING Weight bearing as tolerated with assist device (walker, cane, etc) as directed, use it as long as suggested by your surgeon or therapist, typically at least 4-6 weeks.  POSTOPERATIVE CONSTIPATION PROTOCOL Constipation - defined medically as fewer than three stools per week and severe constipation as less than one stool per week.  One of the most common issues patients have following surgery is constipation.  Even if you have a regular bowel pattern at home, your normal regimen is likely to be disrupted due to multiple reasons following surgery.  Combination of anesthesia, postoperative narcotics, change in appetite and fluid intake all can affect your bowels.  In order to avoid complications following surgery, here are some recommendations in order to help you during your recovery period.  Colace (docusate) - Pick up an over-the-counter form of Colace or another stool softener and take twice a day as long as you are requiring postoperative pain medications.  Take with a full glass of water daily.  If you experience loose stools or diarrhea, hold the colace until you stool forms back up.  If your symptoms do not get better within 1 week or if they get worse, check with your doctor.  Dulcolax (bisacodyl) - Pick up over-the-counter and take as directed by the product  packaging as needed to assist with the movement of your bowels.  Take with a full glass of water.  Use this product as needed if not relieved by Colace only.   MiraLax (polyethylene glycol) - Pick up over-the-counter to have on hand.  MiraLax is a solution that will increase the amount of water in your bowels to assist with bowel movements.  Take as directed and can mix with a glass of water, juice, soda, coffee, or tea.  Take if you go more than two days without a movement. Do not use MiraLax more than once per day. Call your doctor if you are still constipated or irregular after using this medication for 7 days in a row.  If you  continue to have problems with postoperative constipation, please contact the office for further assistance and recommendations.  If you experience "the worst abdominal pain ever" or develop nausea or vomiting, please contact the office immediatly for further recommendations for treatment.  ITCHING  If you experience itching with your medications, try taking only a single pain pill, or even half a pain pill at a time.  You can also use Benadryl over the counter for itching or also to help with sleep.   TED HOSE STOCKINGS Wear the elastic stockings on both legs for three weeks following surgery during the day but you may remove then at night for sleeping.  MEDICATIONS See your medication summary on the "After Visit Summary" that the nursing staff will review with you prior to discharge.  You may have some home medications which will be placed on hold until you complete the course of blood thinner medication.  It is important for you to complete the blood thinner medication as prescribed by your surgeon.  Continue your approved medications as instructed at time of discharge.  PRECAUTIONS If you experience chest pain or shortness of breath - call 911 immediately for transfer to the hospital emergency department.  If you develop a fever greater that 101 F, purulent drainage  from wound, increased redness or drainage from wound, foul odor from the wound/dressing, or calf pain - CONTACT YOUR SURGEON.                                                   FOLLOW-UP APPOINTMENTS Make sure you keep all of your appointments after your operation with your surgeon and caregivers. You should call the office at the above phone number and make an appointment for approximately two weeks after the date of your surgery or on the date instructed by your surgeon outlined in the "After Visit Summary".  RANGE OF MOTION AND STRENGTHENING EXERCISES  These exercises are designed to help you keep full movement of your hip joint. Follow your caregiver's or physical therapist's instructions. Perform all exercises about fifteen times, three times per day or as directed. Exercise both hips, even if you have had only one joint replacement. These exercises can be done on a training (exercise) mat, on the floor, on a table or on a bed. Use whatever works the best and is most comfortable for you. Use music or television while you are exercising so that the exercises are a pleasant break in your day. This will make your life better with the exercises acting as a break in routine you can look forward to.  Lying on your back, slowly slide your foot toward your buttocks, raising your knee up off the floor. Then slowly slide your foot back down until your leg is straight again.  Lying on your back spread your legs as far apart as you can without causing discomfort.  Lying on your side, raise your upper leg and foot straight up from the floor as far as is comfortable. Slowly lower the leg and repeat.  Lying on your back, tighten up the muscle in the front of your thigh (quadriceps muscles). You can do this by keeping your leg straight and trying to raise your heel off the floor. This helps strengthen the largest muscle supporting your knee.  Lying on your back, tighten up the muscles of your  buttocks both with the  legs straight and with the knee bent at a comfortable angle while keeping your heel on the floor.   IF YOU ARE TRANSFERRED TO A SKILLED REHAB FACILITY If the patient is transferred to a skilled rehab facility following release from the hospital, a list of the current medications will be sent to the facility for the patient to continue.  When discharged from the skilled rehab facility, please have the facility set up the patient's [place] prior to being released. Also, the skilled facility will be responsible for providing the patient with their medications at time of release from the facility to include their pain medication, the muscle relaxants, and their blood thinner medication. If the patient is still at the rehab facility at time of the two week follow up appointment, the skilled rehab facility will also need to assist the patient in arranging follow up appointment in our office and any transportation needs.  MAKE SURE YOU:  Understand these instructions.  Get help right away if you are not doing well or get worse.    Pick up stool softner and laxative for home use following surgery while on pain medications. Do not submerge incision under water. Please use good hand washing techniques while changing dressing each day. May shower starting three days after surgery. Please use a clean towel to pat the incision dry following showers. Continue to use ice for pain and swelling after surgery. Do not use any lotions or creams on the incision until instructed by your surgeon.   Do not sit on low chairs, stoools or toilet seats, as it may be difficult to get up from low surfaces   Complete by:  As directed    Driving restrictions   Complete by:  As directed    No driving for two weeks   TED hose   Complete by:  As directed    Use stockings (TED hose) for three weeks on both leg(s).  You may remove them at night for sleeping.   Weight bearing as tolerated   Complete by:  As  directed      Allergies as of 09/23/2018   No Known Allergies     Medication List    TAKE these medications   acetaminophen 500 MG tablet Commonly known as:  TYLENOL Take 1,000 mg by mouth every 6 (six) hours as needed for mild pain or headache.   aspirin 325 MG EC tablet Take 1 tablet (325 mg total) by mouth 2 (two) times daily for 20 days. Then resume one 81 mg aspirin once a day. What changed:    medication strength  how much to take  when to take this  additional instructions   CENTRUM WOMEN PO Take 1 tablet by mouth daily.   diphenhydramine-acetaminophen 25-500 MG Tabs tablet Commonly known as:  TYLENOL PM Take 1 tablet by mouth at bedtime as needed (sleep).   Fish Oil 1200 MG Caps Take 1,200 mg by mouth daily.   HYDROcodone-acetaminophen 5-325 MG tablet Commonly known as:  NORCO/VICODIN Take 1-2 tablets by mouth every 6 (six) hours as needed for severe pain.   hydrocortisone 10 MG tablet Commonly known as:  CORTEF Take 15-20 mg by mouth See admin instructions. Take 2 tablets in the AM and 1 1/2 in the afternoon   levothyroxine 88 MCG tablet Commonly known as:  SYNTHROID Take 88 mcg by mouth daily before breakfast.   methocarbamol 500 MG tablet Commonly known as:  ROBAXIN  Take 1 tablet (500 mg total) by mouth every 6 (six) hours as needed for muscle spasms.   omeprazole 20 MG capsule Commonly known as:  PRILOSEC Take 20 mg by mouth daily.   rosuvastatin 10 MG tablet Commonly known as:  CRESTOR Take 10 mg by mouth daily.   venlafaxine XR 75 MG 24 hr capsule Commonly known as:  EFFEXOR-XR Take 75 mg by mouth daily.   vitamin C 500 MG tablet Commonly known as:  ASCORBIC ACID Take 500 mg by mouth daily.   Vitamin D 50 MCG (2000 UT) Caps Take 2,000 Units by mouth daily.            Discharge Care Instructions  (From admission, onward)         Start     Ordered   09/23/18 0000  Weight bearing as tolerated     09/23/18 0727   09/23/18  0000  Change dressing    Comments:  You may change your dressing on Wednesday, then change the dressing daily with sterile 4 x 4 inch gauze dressing and paper tape.   09/23/18 6546         Follow-up Information    Gaynelle Arabian, MD. Schedule an appointment as soon as possible for a visit on 10/07/2018.   Specialty:  Orthopedic Surgery Contact information: [address] [place] [address] [phone]           Signed: Theresa Duty, PA-C Orthopedic Surgery 10/06/2018, 7:41 AM

## 2020-09-30 IMAGING — RF OPERATIVE RIGHT HIP WITH PELVIS
1 series · 2 of 2 positions shown · non-contrast
Comparison: None.

CLINICAL DATA: Intraoperative right anterior hip replacement

EXAM:
OPERATIVE RIGHT HIP (WITH PELVIS IF PERFORMED) 2 VIEWS
TECHNIQUE: Fluoroscopic spot image(s) were submitted for interpretation
post-operatively.
FLUOROSCOPY TIME:  8 seconds

[Series 1: unknown protocol · 0.20mm/px · 2 of 2 slices shown]
[im 1/2]
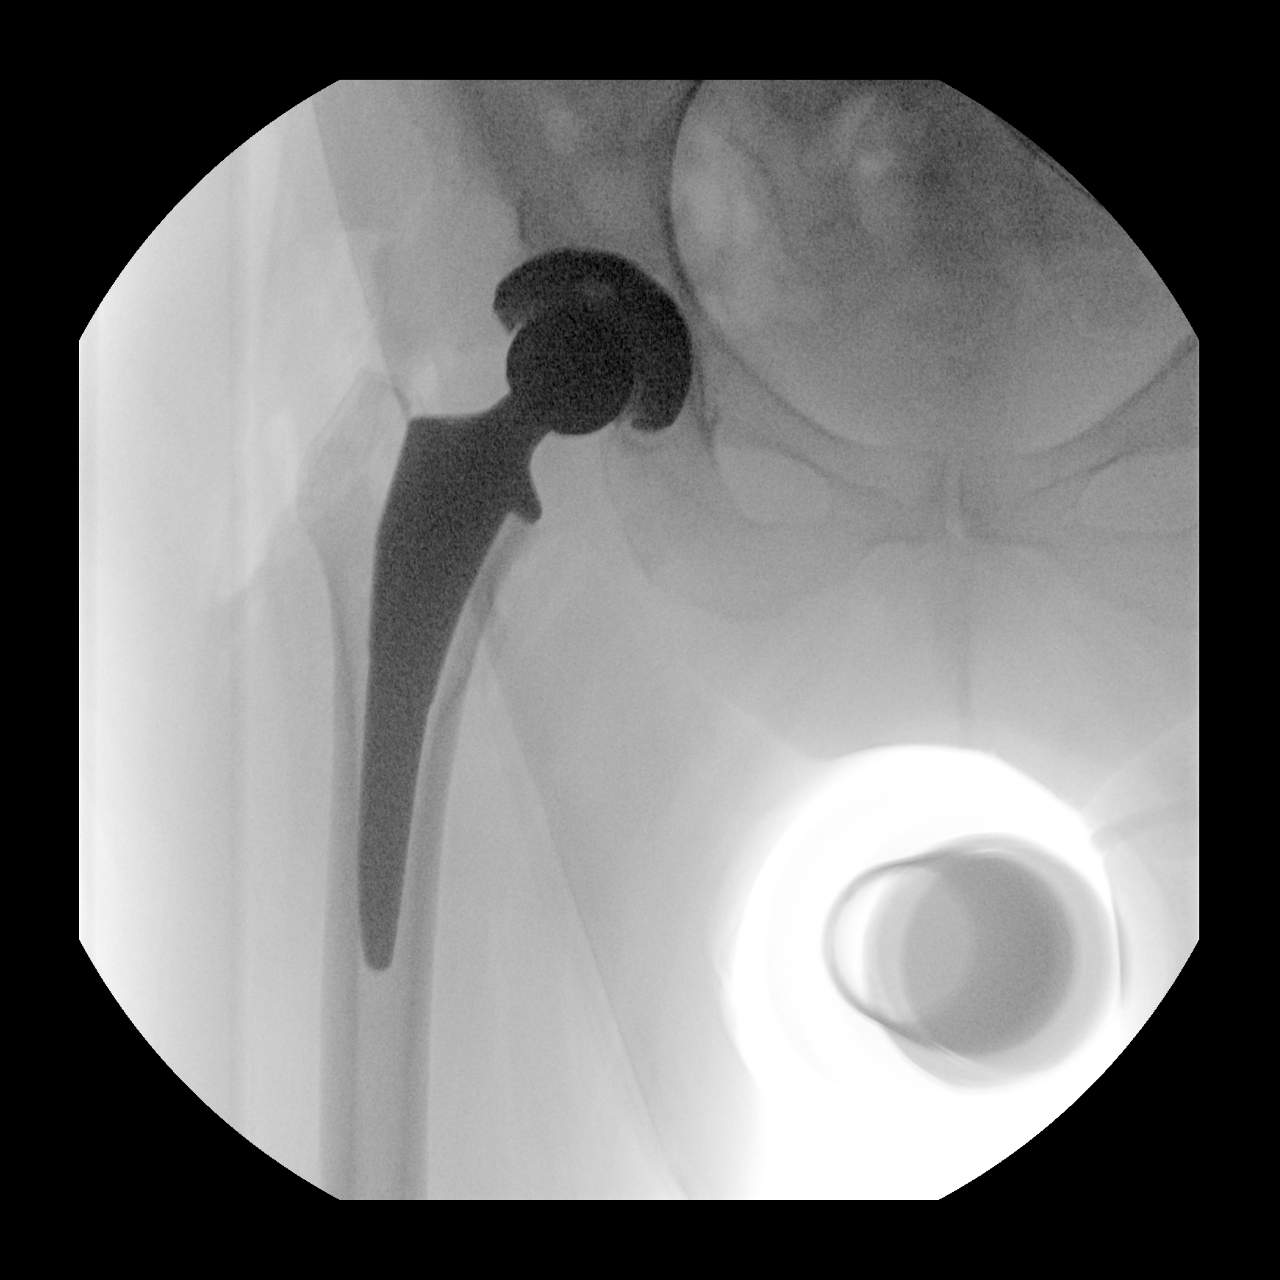
[im 2/2]
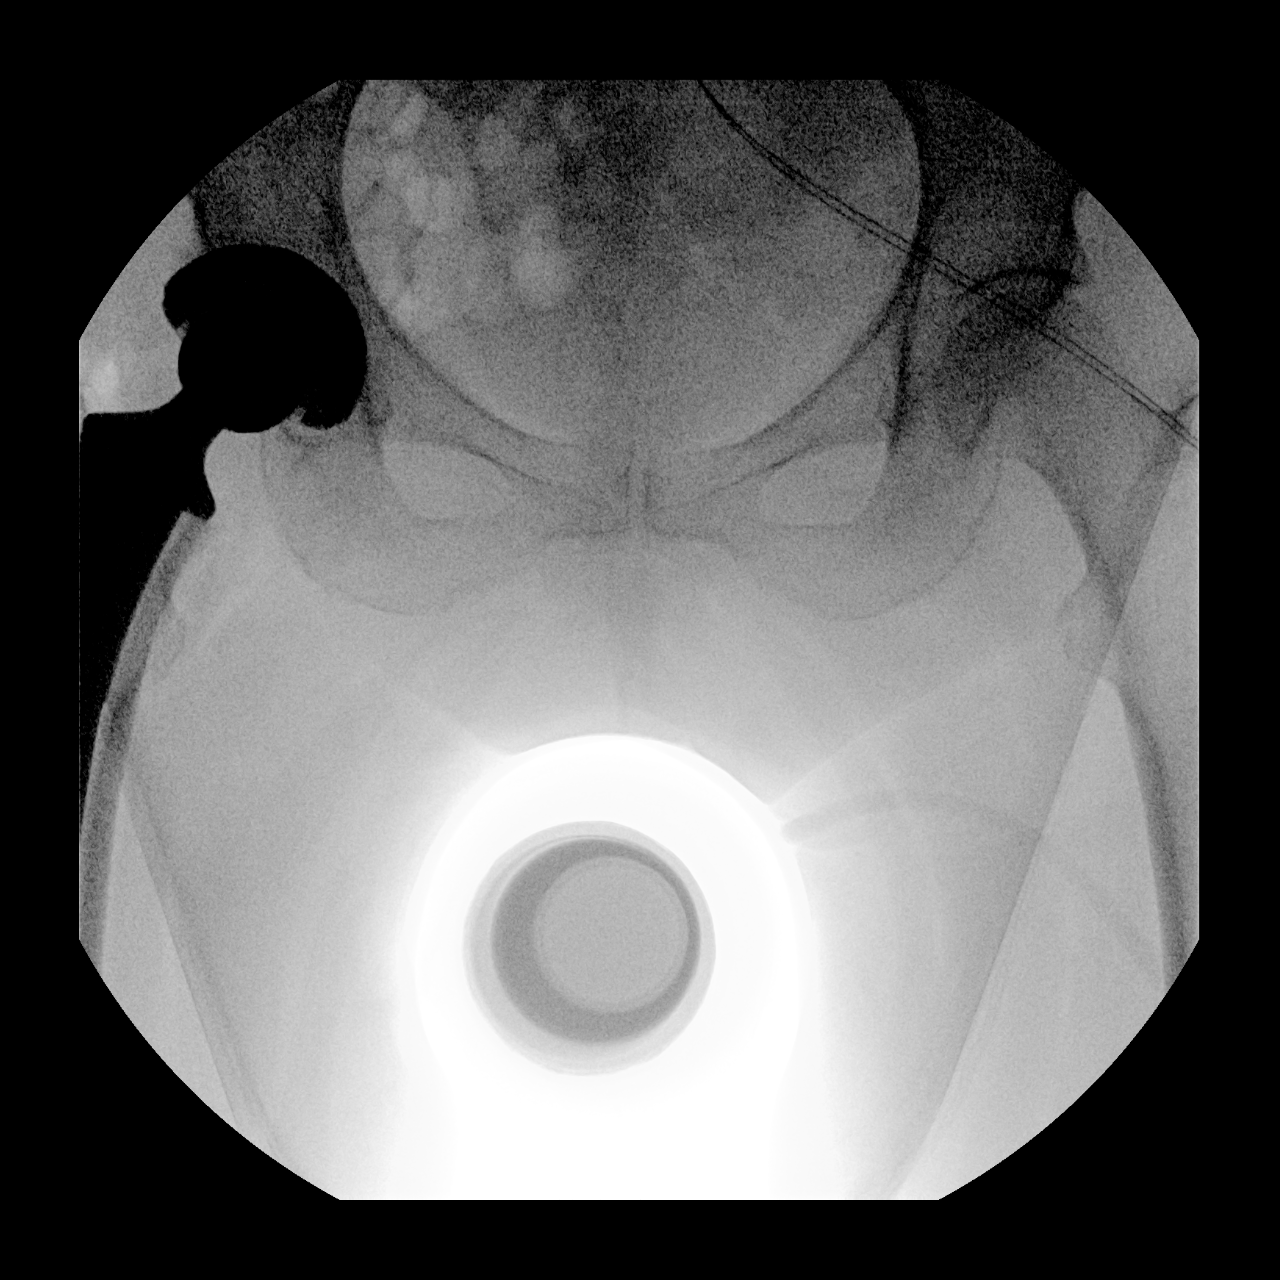

[2 of 2 positions shown; findings below may reference images not displayed]

FINDINGS: Intraoperative fluoroscopic images during right hip replacement.
Right hip arthroplasty in satisfactory position.
IMPRESSION: Intraoperative fluoroscopic images during right hip replacement, as
above.

## 2022-05-09 DIAGNOSIS — M5416 Radiculopathy, lumbar region: Secondary | ICD-10-CM | POA: Diagnosis not present

## 2022-05-14 DIAGNOSIS — M5416 Radiculopathy, lumbar region: Secondary | ICD-10-CM | POA: Diagnosis not present

## 2022-05-16 DIAGNOSIS — M5416 Radiculopathy, lumbar region: Secondary | ICD-10-CM | POA: Diagnosis not present

## 2022-05-21 DIAGNOSIS — M5416 Radiculopathy, lumbar region: Secondary | ICD-10-CM | POA: Diagnosis not present

## 2022-05-23 DIAGNOSIS — M5416 Radiculopathy, lumbar region: Secondary | ICD-10-CM | POA: Diagnosis not present

## 2022-05-25 DIAGNOSIS — Z1231 Encounter for screening mammogram for malignant neoplasm of breast: Secondary | ICD-10-CM | POA: Diagnosis not present

## 2022-05-28 DIAGNOSIS — M5416 Radiculopathy, lumbar region: Secondary | ICD-10-CM | POA: Diagnosis not present

## 2022-05-30 DIAGNOSIS — M5416 Radiculopathy, lumbar region: Secondary | ICD-10-CM | POA: Diagnosis not present

## 2022-06-01 DIAGNOSIS — M5416 Radiculopathy, lumbar region: Secondary | ICD-10-CM | POA: Diagnosis not present

## 2022-06-04 DIAGNOSIS — M5416 Radiculopathy, lumbar region: Secondary | ICD-10-CM | POA: Diagnosis not present

## 2022-06-05 DIAGNOSIS — Z79899 Other long term (current) drug therapy: Secondary | ICD-10-CM | POA: Diagnosis not present

## 2022-06-06 DIAGNOSIS — M5416 Radiculopathy, lumbar region: Secondary | ICD-10-CM | POA: Diagnosis not present

## 2022-06-11 DIAGNOSIS — M5416 Radiculopathy, lumbar region: Secondary | ICD-10-CM | POA: Diagnosis not present

## 2022-06-13 DIAGNOSIS — M5416 Radiculopathy, lumbar region: Secondary | ICD-10-CM | POA: Diagnosis not present

## 2022-06-18 DIAGNOSIS — M5416 Radiculopathy, lumbar region: Secondary | ICD-10-CM | POA: Diagnosis not present

## 2022-06-18 NOTE — Patient Instructions (Signed)
SURGICAL WAITING ROOM VISITATION  Patients having surgery or a procedure may have no more than 2 support people in the waiting area - these visitors may rotate.    Children under the age of 61 must have an adult with them who is not the patient.  Due to an increase in RSV and influenza rates and associated hospitalizations, children ages 52 and under may not visit patients in [place].  If the patient needs to stay at the hospital during part of their recovery, the visitor guidelines for inpatient rooms apply. Pre-op nurse will coordinate an appropriate time for 1 support person to accompany patient in pre-op.  This support person may not rotate.    Please refer to the [facility] website for the visitor guidelines for Inpatients (after your surgery is over and you are in a regular room).       Your procedure is scheduled on:  07/02/22    Report to [facility] Main Entrance    Report to admitting at   0900AM   Call this number if you have problems the morning of surgery [phone]   Do not eat food :After Midnight.   After Midnight you may have the following liquids until ___ 0830___ AM DAY OF SURGERY  Water Non-Citrus Juices (without pulp, NO RED-Apple, White grape, White cranberry) Black Coffee (NO MILK/CREAM OR CREAMERS, sugar ok)  Clear Tea (NO MILK/CREAM OR CREAMERS, sugar ok) regular and decaf                             Plain Jell-O (NO RED)                                           Fruit ices (not with fruit pulp, NO RED)                                     Popsicles (NO RED)                                                               Sports drinks like Gatorade (NO RED)                    The day of surgery:  Drink ONE (1) Pre-Surgery Clear Ensure or G2 at   0830AM  ( have compled by ) the morning of surgery. Drink in one sitting. Do not sip.  This drink was given to you during your hospital  pre-op appointment visit. Nothing else to drink  after completing the  Pre-Surgery Clear Ensure or G2.          If you have questions, please contact your surgeon's office.       Oral Hygiene is also important to reduce your risk of infection.                                    Remember - BRUSH YOUR TEETH THE MORNING OF SURGERY WITH  YOUR REGULAR TOOTHPASTE  DENTURES WILL BE REMOVED PRIOR TO SURGERY PLEASE DO NOT APPLY "Poly grip" OR ADHESIVES!!!   Do NOT smoke after Midnight   Take these medicines the morning of surgery with A SIP OF WATER:  cortef, synthroid, omeprazole, effexor   DO NOT TAKE ANY ORAL DIABETIC MEDICATIONS DAY OF YOUR SURGERY  Bring CPAP mask and tubing day of surgery.                              You may not have any metal on your body including hair pins, jewelry, and body piercing             Do not wear make-up, lotions, powders, perfumes/cologne, or deodorant  Do not wear nail polish including gel and S&S, artificial/acrylic nails, or any other type of covering on natural nails including finger and toenails. If you have artificial nails, gel coating, etc. that needs to be removed by a nail salon please have this removed prior to surgery or surgery may need to be canceled/ delayed if the surgeon/ anesthesia feels like they are unable to be safely monitored.   Do not shave  48 hours prior to surgery.               Men may shave face and neck.   Do not bring valuables to the hospital. [place].   Contacts, glasses, dentures or bridgework may not be worn into surgery.   Bring small overnight bag day of surgery.   DO NOT [place]. PHARMACY WILL DISPENSE MEDICATIONS LISTED ON YOUR MEDICATION LIST TO YOU DURING YOUR ADMISSION [place]!    Patients discharged on the day of surgery will not be allowed to drive home.  Someone NEEDS to stay with you for the first 24 hours after anesthesia.   Special Instructions: Bring a  copy of your healthcare power of attorney and living will documents the day of surgery if you haven't scanned them before.              Please read over the following fact sheets you were given: IF [place] [phone]   If you received a COVID test during your pre-op visit  it is requested that you wear a mask when out in public, stay away from anyone that may not be feeling well and notify your surgeon if you develop symptoms. If you test positive for Covid or have been in contact with anyone that has tested positive in the last 10 days please notify you surgeon.    [place] - Preparing for Surgery Before surgery, you can play an important role.  Because skin is not sterile, your skin needs to be as free of germs as possible.  You can reduce the number of germs on your skin by washing with CHG (chlorahexidine gluconate) soap before surgery.  CHG is an antiseptic cleaner which kills germs and bonds with the skin to continue killing germs even after washing. Please DO NOT use if you have an allergy to CHG or antibacterial soaps.  If your skin becomes reddened/irritated stop using the CHG and inform your nurse when you arrive at Short Stay. Do not shave (including legs and underarms) for at least 48 hours prior to the  first CHG shower.  You may shave your face/neck. Please follow these instructions carefully:  1.  Shower with CHG Soap the night before surgery and the  morning of Surgery.  2.  If you choose to wash your hair, wash your hair first as usual with your  normal  shampoo.  3.  After you shampoo, rinse your hair and body thoroughly to remove the  shampoo.                           4.  Use CHG as you would any other liquid soap.  You can apply chg directly  to the skin and wash                       Gently with a scrungie or clean washcloth.  5.  Apply the CHG Soap to your body ONLY FROM THE NECK DOWN.   Do not use on face/ open                            Wound or open sores. Avoid contact with eyes, ears mouth and genitals (private parts).                       Wash face,  Genitals (private parts) with your normal soap.             6.  Wash thoroughly, paying special attention to the area where your surgery  will be performed.  7.  Thoroughly rinse your body with warm water from the neck down.  8.  DO NOT shower/wash with your normal soap after using and rinsing off  the CHG Soap.                9.  Pat yourself dry with a clean towel.            10.  Wear clean pajamas.            11.  Place clean sheets on your bed the night of your first shower and do not  sleep with pets. Day of Surgery : Do not apply any lotions/deodorants the morning of surgery.  Please wear clean clothes to the hospital/surgery center.  FAILURE TO FOLLOW THESE INSTRUCTIONS MAY RESULT IN THE CANCELLATION OF YOUR SURGERY PATIENT SIGNATURE_________________________________  NURSE SIGNATURE__________________________________  ________________________________________________________________________

## 2022-06-18 NOTE — Progress Notes (Addendum)
Anesthesia Review:  PCP: Romero Liner- clearance dated 05/30/22 on chart  LOV 04/18/22  Cardiologist : Chest x-ray : EKG : Echo : Stress test: Cardiac Cath :  Activity level:  Sleep Study/ CPAP : Fasting Blood Sugar :      / Checks Blood Sugar -- times a day:   Blood Thinner/ Instructions /Last Dose: ASA / Instructions/ Last Dose :    Pt with Addison's disease

## 2022-06-19 ENCOUNTER — Encounter (HOSPITAL_COMMUNITY): Payer: Self-pay

## 2022-06-19 ENCOUNTER — Other Ambulatory Visit: Payer: Self-pay

## 2022-06-19 ENCOUNTER — Encounter (HOSPITAL_COMMUNITY)
Admission: RE | Admit: 2022-06-19 | Discharge: 2022-06-19 | Disposition: A | Discharge status: Home / Self Care | Payer: Medicare Other | Source: Ambulatory Visit | Attending: Orthopedic Surgery | Admitting: Orthopedic Surgery

## 2022-06-19 VITALS — BP 154/81 | HR 79 | Temp 98.3°F | Resp 16 | Ht 59.0 in | Wt 124.0 lb

## 2022-06-19 DIAGNOSIS — R9431 Abnormal electrocardiogram [ECG] [EKG]: Secondary | ICD-10-CM | POA: Insufficient documentation

## 2022-06-19 DIAGNOSIS — Z01818 Encounter for other preprocedural examination: Secondary | ICD-10-CM

## 2022-06-19 DIAGNOSIS — Z79899 Other long term (current) drug therapy: Secondary | ICD-10-CM | POA: Diagnosis not present

## 2022-06-19 HISTORY — DX: Dyspnea, unspecified: R06.00

## 2022-06-19 HISTORY — DX: Unspecified cataract: H26.9

## 2022-06-19 HISTORY — DX: Strain of muscle, fascia and tendon of other parts of biceps, left arm, initial encounter: S46.212A

## 2022-06-19 HISTORY — DX: Prediabetes: R73.03

## 2022-06-19 LAB — CBC
HCT: 50.7 % — ABNORMAL HIGH (ref 36.0–46.0)
Hemoglobin: 16.7 g/dL — ABNORMAL HIGH (ref 12.0–15.0)
MCH: 33.3 pg (ref 26.0–34.0)
MCHC: 32.9 g/dL (ref 30.0–36.0)
MCV: 101.2 fL — ABNORMAL HIGH (ref 80.0–100.0)
Platelets: 238 10*3/uL (ref 150–400)
RBC: 5.01 MIL/uL (ref 3.87–5.11)
RDW: 12.8 % (ref 11.5–15.5)
WBC: 11.6 10*3/uL — ABNORMAL HIGH (ref 4.0–10.5)
nRBC: 0 % (ref 0.0–0.2)

## 2022-06-19 LAB — HEMOGLOBIN A1C
Hgb A1c MFr Bld: 6 % — ABNORMAL HIGH (ref 4.8–5.6)
Mean Plasma Glucose: 125.5 mg/dL

## 2022-06-19 LAB — BASIC METABOLIC PANEL
Anion gap: 9 (ref 5–15)
BUN: 19 mg/dL (ref 8–23)
CO2: 26 mmol/L (ref 22–32)
Calcium: 9.3 mg/dL (ref 8.9–10.3)
Chloride: 105 mmol/L (ref 98–111)
Creatinine, Ser: 0.77 mg/dL (ref 0.44–1.00)
GFR, Estimated: >60 mL/min (ref >60)
Glucose, Bld: 141 mg/dL — ABNORMAL HIGH (ref 70–99)
Potassium: 3.8 mmol/L (ref 3.5–5.1)
Sodium: 140 mmol/L (ref 135–145)

## 2022-06-19 LAB — SURGICAL PCR SCREEN
MRSA, PCR: NEGATIVE
Staphylococcus aureus: NEGATIVE

## 2022-06-19 LAB — GLUCOSE, CAPILLARY: Glucose-Capillary: 133 mg/dL — ABNORMAL HIGH (ref 70–99)

## 2022-06-21 NOTE — H&P (Addendum)
TOTAL KNEE ADMISSION H&P  Patient is being admitted for left total knee arthroplasty.  Subjective:  Chief Complaint: Left knee pain.  HPI: Krystal Swanson, 72 y.o. female has a history of pain and functional disability in the left knee due to arthritis and has failed non-surgical conservative treatments for greater than 12 weeks to include corticosteriod injections, viscosupplementation injections, use of assistive devices, and activity modification. Onset of symptoms was gradual, starting 5 years ago with gradually worsening course since that time. The patient noted no past surgery on the left knee.  Patient currently rates pain in the left knee at 8 out of 10 with activity. Patient has worsening of pain with activity and weight bearing and pain that interferes with activities of daily living. Patient has evidence of subchondral sclerosis, periarticular osteophytes, and joint space narrowing by imaging studies. There is no active infection.  Patient Active Problem List   Diagnosis Date Noted   OA (osteoarthritis) of hip 09/22/2018   Chronic pain of left knee 01/17/2017   Trochanteric bursitis of right hip 01/17/2017   Addison's disease ([place]) 03/07/2016   Hypothyroid 03/07/2016   Vitamin D deficiency 03/07/2016    Past Medical History:  Diagnosis Date   Addison's disease ([place])    Anxiety    Arthritis    Bilateral cataracts    Depression    Diverticulosis 2019   Noted on colonoscopy   Dyspnea    with exertion   GERD (gastroesophageal reflux disease)    History of colon polyps    Hyperlipidemia    Hypothyroidism    Osteoporosis    Pneumonia    Pre-diabetes    Seasonal allergies    Tear of left biceps muscle    Vitamin D deficiency     Past Surgical History:  Procedure Laterality Date   colon polyps  2009   COLONOSCOPY  2014   Colonic polyp. Mild sigmoid diverticulosis.    TOTAL HIP ARTHROPLASTY Right 09/22/2018   Procedure: TOTAL HIP ARTHROPLASTY ANTERIOR APPROACH;   Surgeon: Gaynelle Arabian, MD;  Location: [facility];  Service: Orthopedics;  Laterality: Right;   UPPER GI ENDOSCOPY      Prior to Admission medications   Medication Sig Start Date End Date Taking? Authorizing Provider  Ascorbic Acid (VITAMIN C) 1000 MG tablet Take 1,000 mg by mouth daily.   Yes [provider]  CALCIUM MAGNESIUM ZINC PO Take 1 tablet by mouth daily.   Yes [provider]  Cholecalciferol (VITAMIN D) 125 MCG (5000 UT) CAPS Take 5,000 Units by mouth daily.   Yes [provider]  cyanocobalamin (VITAMIN B12) 1000 MCG tablet Take 1,000 mcg by mouth daily.   Yes [provider]  famotidine (PEPCID) 20 MG tablet Take 20 mg by mouth daily as needed for heartburn or indigestion.   Yes [provider]  hydrocortisone (CORTEF) 10 MG tablet Take 15-20 mg by mouth See admin instructions. Take 20 mg by mouth in the morning and 15 mg in the afternoon 08/02/17  Yes [provider]  levothyroxine (SYNTHROID, LEVOTHROID) 88 MCG tablet Take 88 mcg by mouth daily before breakfast.  09/07/16  Yes [provider]  loratadine (CLARITIN) 10 MG tablet Take 10 mg by mouth daily.   Yes [provider]  Omega-3 Fatty Acids (FISH OIL) 1000 MG CAPS Take 1,000 mg by mouth 3 (three) times a week.   Yes [provider]  OVER THE COUNTER MEDICATION Take 1 capsule by mouth every evening. Menopause Transitions  Yes [provider]  rosuvastatin (CRESTOR) 10 MG tablet Take 10 mg by mouth every evening. 06/10/17  Yes [provider]  venlafaxine XR (EFFEXOR-XR) 75 MG 24 hr capsule Take 75 mg by mouth daily. 06/10/17  Yes [provider]  vitamin E 180 MG (400 UNITS) capsule Take 400 Units by mouth daily.   Yes [provider]  HYDROcodone-acetaminophen (NORCO/VICODIN) 5-325 MG tablet Take 1-2 tablets by mouth every 6 (six) hours as needed for severe pain. Patient not taking: Reported on 06/15/2022 09/23/18    Edmisten, Ok Anis, PA  methocarbamol (ROBAXIN) 500 MG tablet Take 1 tablet (500 mg total) by mouth every 6 (six) hours as needed for muscle spasms. Patient not taking: Reported on 06/15/2022 09/23/18   Edmisten, Kristie L, PA  OVER THE COUNTER MEDICATION Vitamin d 3 5000 units daily    [provider]  solifenacin (VESICARE) 5 MG tablet Take 5 mg by mouth daily as needed (overactive bladder). 06/05/22   [provider]    No Known Allergies  Social History   Socioeconomic History   Marital status: Married    Spouse name: Not on file   Number of children: Not on file   Years of education: Not on file   Highest education level: Not on file  Occupational History   Not on file  Tobacco Use   Smoking status: Former    Packs/day: 1.00    Years: 15.00    Total pack years: 15.00    Types: Cigarettes   Smokeless tobacco: Never   Tobacco comments:    quit 25 years ago  Vaping Use   Vaping Use: Never used  Substance and Sexual Activity   Alcohol use: Yes    Comment: 1 glass of wine weekly   Drug use: Never   Sexual activity: Not on file  Other Topics Concern   Not on file  Social History Narrative   Not on file   Social Determinants of Health   Financial Resource Strain: Not on file  Food Insecurity: Not on file  Transportation Needs: Not on file  Physical Activity: Not on file  Stress: Not on file  Social Connections: Not on file  Intimate Partner Violence: Not on file    Tobacco Use: Medium Risk (06/19/2022)   Patient History    Smoking Tobacco Use: Former    Smokeless Tobacco Use: Never    Passive Exposure: Not on file   Social History   Substance and Sexual Activity  Alcohol Use Yes   Comment: 1 glass of wine weekly    Family History  Problem Relation Age of Onset   Peptic Ulcer Mother    Heart attack Father    Stroke Father    Peptic Ulcer Father        lost 6 pints of blood   Colon cancer Maternal Aunt    Breast cancer Maternal Aunt      ROS  Objective:  Physical Exam: The patient is a pleasant, well-developed female alert and oriented in no apparent distress.    Left Hip Exam:  The range of motion: normal without discomfort.    Left Knee Exam:  No effusion present. No swelling present.  The range of motion is: 0 to 125 degrees.  Slight crepitus on range of motion of the knee.  Positive medial joint line tenderness.  No lateral joint line tenderness.  Small amount of varus/valgus laxity in extension. No AP laxity in flexion.    The  patient's sensation and motor function are intact in their lower extremities. Their distal pulses are 2+. The bilateral calves are soft and non-tender.  RESULTS  - AP and lateral of the left knee dated 11/2021 demonstrate bone-on-bone arthritis in the medial and patellofemoral compartments of the left knee.  Assessment/Plan:  End stage arthritis, left knee   The patient history, physical examination, clinical judgment of the provider and imaging studies are consistent with end stage degenerative joint disease of the left knee and total knee arthroplasty is deemed medically necessary. The treatment options including medical management, injection therapy arthroscopy and arthroplasty were discussed at length. The risks and benefits of total knee arthroplasty were presented and reviewed. The risks due to aseptic loosening, infection, stiffness, patella tracking problems, thromboembolic complications and other imponderables were discussed. The patient acknowledged the explanation, agreed to proceed with the plan and consent was signed. Patient is being admitted for inpatient treatment for surgery, pain control, PT, OT, prophylactic antibiotics, VTE prophylaxis, progressive ambulation and ADLs and discharge planning. The patient is planning to be discharged home with OPPT scheduled.   Patient's anticipated LOS is less than 2 midnights, meeting these requirements: - Lives within 1 hour of  care - Has a competent adult at home to recover with post-op recover - NO history of  - Chronic pain requiring opiods  - Diabetes  - Coronary Artery Disease  - Heart failure  - Heart attack  - Stroke  - DVT/VTE  - Cardiac arrhythmia  - Respiratory Failure/COPD  - Renal failure  - Anemia  - Advanced Liver disease    Therapy Plans: [facility] in [place] Disposition: Home with husband Planned DVT Prophylaxis: Aspirin DME Needed: None PCP: Kennith Maes, MD (clearance received) Endocrinologist: Peri Jefferson, PA-C - clearance pending TXA: IV Allergies: NDKA Anesthesia Concerns: None BMI: 25.2 Last HgbA1c: 6.0 Pharmacy: [facility] ([address])  Other:  Hx Addison's disease. Per endocrinologist letter: -On '20mg'$  hydrocortisone qAM and '15mg'$  hydrocortisone qPM at baseline. -Double normal morning and evening doses of hydrocortisone on day prior to surgery -On DOS: '50mg'$  IV hydrocortisone prior to surgery -Take double normal evening dose on DOS, after surgery is completed -From POD 1- POD 7, take double morning and evening dose of oral hydrocortisone -Pt is aware of her pre-op medication adjustments  -Fam hx Factor V leiden, patient states that she tested negative for this. Used aspirin '325mg'$  BID after THA in 2020. -Metal allergy testing ordered. Pt later clarifies that she does not have any issues wearing jewelry. -Order bedside commode during hospital stay  - Patient was instructed on what medications to stop prior to surgery. - Follow-up visit in 2 weeks with Dr. Wynelle Link - Begin physical therapy following surgery - Pre-operative lab work as pre-surgical testing - Prescriptions will be provided in hospital at time of discharge  Shearon Balo, PA-C Orthopedic Surgery [facility] [place]

## 2022-06-26 DIAGNOSIS — M5416 Radiculopathy, lumbar region: Secondary | ICD-10-CM | POA: Diagnosis not present

## 2022-06-26 DIAGNOSIS — M4316 Spondylolisthesis, lumbar region: Secondary | ICD-10-CM | POA: Diagnosis not present

## 2022-07-02 ENCOUNTER — Observation Stay (HOSPITAL_COMMUNITY)
Admission: RE | Admit: 2022-07-02 | Discharge: 2022-07-03 | Disposition: A | Discharge status: Home / Self Care | Payer: Medicare Other | Attending: Orthopedic Surgery | Admitting: Orthopedic Surgery

## 2022-07-02 ENCOUNTER — Ambulatory Visit (HOSPITAL_COMMUNITY): Payer: Medicare Other | Admitting: Physician Assistant

## 2022-07-02 ENCOUNTER — Other Ambulatory Visit: Payer: Self-pay

## 2022-07-02 ENCOUNTER — Ambulatory Visit (HOSPITAL_BASED_OUTPATIENT_CLINIC_OR_DEPARTMENT_OTHER): Payer: Medicare Other | Admitting: Anesthesiology

## 2022-07-02 ENCOUNTER — Encounter (HOSPITAL_COMMUNITY): Payer: Self-pay | Admitting: Orthopedic Surgery

## 2022-07-02 ENCOUNTER — Encounter (HOSPITAL_COMMUNITY)
Admission: RE | Disposition: A | Discharge status: Home / Self Care | Payer: Self-pay | Source: Home / Self Care | Attending: Orthopedic Surgery

## 2022-07-02 DIAGNOSIS — Z87891 Personal history of nicotine dependence: Secondary | ICD-10-CM | POA: Diagnosis not present

## 2022-07-02 DIAGNOSIS — M1712 Unilateral primary osteoarthritis, left knee: Principal | ICD-10-CM | POA: Diagnosis present

## 2022-07-02 DIAGNOSIS — E039 Hypothyroidism, unspecified: Secondary | ICD-10-CM | POA: Insufficient documentation

## 2022-07-02 DIAGNOSIS — G8918 Other acute postprocedural pain: Secondary | ICD-10-CM | POA: Diagnosis not present

## 2022-07-02 DIAGNOSIS — M179 Osteoarthritis of knee, unspecified: Secondary | ICD-10-CM | POA: Diagnosis present

## 2022-07-02 DIAGNOSIS — Z79899 Other long term (current) drug therapy: Secondary | ICD-10-CM | POA: Diagnosis not present

## 2022-07-02 DIAGNOSIS — Z01818 Encounter for other preprocedural examination: Secondary | ICD-10-CM

## 2022-07-02 DIAGNOSIS — Z96641 Presence of right artificial hip joint: Secondary | ICD-10-CM | POA: Insufficient documentation

## 2022-07-02 HISTORY — PX: TOTAL KNEE ARTHROPLASTY: SHX125

## 2022-07-02 SURGERY — ARTHROPLASTY, KNEE, TOTAL
Anesthesia: Monitor Anesthesia Care | Site: Knee | Laterality: Left

## 2022-07-02 MED ORDER — BUPIVACAINE LIPOSOME 1.3 % IJ SUSP
INTRAMUSCULAR | Status: AC
  Filled 2022-07-02: qty 20

## 2022-07-02 MED ORDER — CEFAZOLIN SODIUM-DEXTROSE 2-4 GM/100ML-% IV SOLN
2.0000 g | INTRAVENOUS | Status: AC
  Administered 2022-07-02: 2 g via INTRAVENOUS
  Filled 2022-07-02: qty 100

## 2022-07-02 MED ORDER — CEFAZOLIN SODIUM-DEXTROSE 2-4 GM/100ML-% IV SOLN
2.0000 g | Freq: Four times a day (QID) | INTRAVENOUS | Status: AC
  Administered 2022-07-02 (×2): 2 g via INTRAVENOUS
  Filled 2022-07-02 (×2): qty 100

## 2022-07-02 MED ORDER — DEXAMETHASONE SODIUM PHOSPHATE 10 MG/ML IJ SOLN
INTRAMUSCULAR | Status: AC
  Filled 2022-07-02: qty 1

## 2022-07-02 MED ORDER — ONDANSETRON HCL 4 MG PO TABS: 4.0000 mg | ORAL_TABLET | Freq: Four times a day (QID) | ORAL | Status: DC | PRN

## 2022-07-02 MED ORDER — FAMOTIDINE 20 MG PO TABS: 20.0000 mg | ORAL_TABLET | Freq: Every day | ORAL | Status: DC | PRN

## 2022-07-02 MED ORDER — OXYCODONE HCL 5 MG/5ML PO SOLN: 5.0000 mg | Freq: Once | ORAL | Status: DC | PRN

## 2022-07-02 MED ORDER — CHLORHEXIDINE GLUCONATE 0.12 % MT SOLN
15.0000 mL | Freq: Once | OROMUCOSAL | Status: AC
  Administered 2022-07-02: 15 mL via OROMUCOSAL

## 2022-07-02 MED ORDER — SODIUM CHLORIDE 0.9 % IV SOLN: INTRAVENOUS | Status: DC

## 2022-07-02 MED ORDER — LIDOCAINE HCL (CARDIAC) PF 100 MG/5ML IV SOSY
PREFILLED_SYRINGE | INTRAVENOUS | Status: DC | PRN
  Administered 2022-07-02: 40 mg via INTRAVENOUS

## 2022-07-02 MED ORDER — ACETAMINOPHEN 160 MG/5ML PO SOLN: 325.0000 mg | ORAL | Status: DC | PRN

## 2022-07-02 MED ORDER — BUPIVACAINE IN DEXTROSE 0.75-8.25 % IT SOLN
INTRATHECAL | Status: DC | PRN
  Administered 2022-07-02: 1.4 mL via INTRATHECAL

## 2022-07-02 MED ORDER — MEPERIDINE HCL 50 MG/ML IJ SOLN
INTRAMUSCULAR | Status: AC
  Administered 2022-07-02: 12.5 mg via INTRAVENOUS
  Filled 2022-07-02: qty 1

## 2022-07-02 MED ORDER — SODIUM CHLORIDE 0.9 % IR SOLN
Status: DC | PRN
  Administered 2022-07-02: 1000 mL

## 2022-07-02 MED ORDER — HYDROCORTISONE SOD SUC (PF) 100 MG IJ SOLR
50.0000 mg | Freq: Once | INTRAMUSCULAR | Status: AC
  Administered 2022-07-02: 50 mg via INTRAVENOUS
  Filled 2022-07-02: qty 1

## 2022-07-02 MED ORDER — DEXAMETHASONE SODIUM PHOSPHATE 10 MG/ML IJ SOLN: 8.0000 mg | Freq: Once | INTRAMUSCULAR | Status: DC

## 2022-07-02 MED ORDER — MIDAZOLAM HCL 5 MG/5ML IJ SOLN
INTRAMUSCULAR | Status: DC | PRN
  Administered 2022-07-02: 1 mg via INTRAVENOUS

## 2022-07-02 MED ORDER — FESOTERODINE FUMARATE ER 4 MG PO TB24
4.0000 mg | ORAL_TABLET | Freq: Every day | ORAL | Status: DC
  Administered 2022-07-03: 4 mg via ORAL
  Filled 2022-07-02: qty 1

## 2022-07-02 MED ORDER — LEVOTHYROXINE SODIUM 88 MCG PO TABS
88.0000 ug | ORAL_TABLET | Freq: Every day | ORAL | Status: DC
  Administered 2022-07-03: 88 ug via ORAL
  Filled 2022-07-02: qty 1

## 2022-07-02 MED ORDER — ASPIRIN 81 MG PO CHEW
81.0000 mg | CHEWABLE_TABLET | Freq: Two times a day (BID) | ORAL | Status: DC
  Administered 2022-07-02 – 2022-07-03 (×2): 81 mg via ORAL
  Filled 2022-07-02 (×2): qty 1

## 2022-07-02 MED ORDER — FENTANYL CITRATE (PF) 100 MCG/2ML IJ SOLN
INTRAMUSCULAR | Status: AC
  Filled 2022-07-02: qty 2

## 2022-07-02 MED ORDER — HYDROCORTISONE 20 MG PO TABS
40.0000 mg | ORAL_TABLET | Freq: Every evening | ORAL | Status: DC
  Administered 2022-07-02: 40 mg via ORAL
  Filled 2022-07-02: qty 2

## 2022-07-02 MED ORDER — MIDAZOLAM HCL 2 MG/2ML IJ SOLN
INTRAMUSCULAR | Status: AC
  Filled 2022-07-02: qty 2

## 2022-07-02 MED ORDER — LACTATED RINGERS IV SOLN: INTRAVENOUS | Status: DC

## 2022-07-02 MED ORDER — BUPIVACAINE LIPOSOME 1.3 % IJ SUSP: 20.0000 mL | Freq: Once | INTRAMUSCULAR | Status: DC

## 2022-07-02 MED ORDER — ROSUVASTATIN CALCIUM 10 MG PO TABS
10.0000 mg | ORAL_TABLET | Freq: Every evening | ORAL | Status: DC
  Administered 2022-07-02: 10 mg via ORAL
  Filled 2022-07-02: qty 1

## 2022-07-02 MED ORDER — LORATADINE 10 MG PO TABS
10.0000 mg | ORAL_TABLET | Freq: Every day | ORAL | Status: DC
  Filled 2022-07-02: qty 1

## 2022-07-02 MED ORDER — ROPIVACAINE HCL 5 MG/ML IJ SOLN
INTRAMUSCULAR | Status: DC | PRN
  Administered 2022-07-02: 20 mL via PERINEURAL

## 2022-07-02 MED ORDER — MENTHOL 3 MG MT LOZG: 1.0000 | LOZENGE | OROMUCOSAL | Status: DC | PRN

## 2022-07-02 MED ORDER — METOCLOPRAMIDE HCL 5 MG PO TABS: 5.0000 mg | ORAL_TABLET | Freq: Three times a day (TID) | ORAL | Status: DC | PRN

## 2022-07-02 MED ORDER — ONDANSETRON HCL 4 MG/2ML IJ SOLN: 4.0000 mg | Freq: Once | INTRAMUSCULAR | Status: DC | PRN

## 2022-07-02 MED ORDER — FENTANYL CITRATE PF 50 MCG/ML IJ SOSY
50.0000 ug | PREFILLED_SYRINGE | INTRAMUSCULAR | Status: DC
  Administered 2022-07-02 (×2): 50 ug via INTRAVENOUS

## 2022-07-02 MED ORDER — POVIDONE-IODINE 10 % EX SWAB: 2.0000 | Freq: Once | CUTANEOUS | Status: DC

## 2022-07-02 MED ORDER — POLYETHYLENE GLYCOL 3350 17 G PO PACK: 17.0000 g | PACK | Freq: Every day | ORAL | Status: DC | PRN

## 2022-07-02 MED ORDER — SODIUM CHLORIDE (PF) 0.9 % IJ SOLN
INTRAMUSCULAR | Status: DC | PRN
  Administered 2022-07-02: 50 mL
  Administered 2022-07-02: 10 mL

## 2022-07-02 MED ORDER — ACETAMINOPHEN 10 MG/ML IV SOLN
1000.0000 mg | Freq: Four times a day (QID) | INTRAVENOUS | Status: DC
  Administered 2022-07-02: 1000 mg via INTRAVENOUS
  Filled 2022-07-02: qty 100

## 2022-07-02 MED ORDER — LABETALOL HCL 5 MG/ML IV SOLN
INTRAVENOUS | Status: DC | PRN
  Administered 2022-07-02: 2.5 mg via INTRAVENOUS

## 2022-07-02 MED ORDER — DIPHENHYDRAMINE HCL 12.5 MG/5ML PO ELIX: 12.5000 mg | ORAL_SOLUTION | ORAL | Status: DC | PRN

## 2022-07-02 MED ORDER — VENLAFAXINE HCL ER 75 MG PO CP24
75.0000 mg | ORAL_CAPSULE | Freq: Every day | ORAL | Status: DC
  Administered 2022-07-03: 75 mg via ORAL
  Filled 2022-07-02: qty 1

## 2022-07-02 MED ORDER — ACETAMINOPHEN 325 MG PO TABS: 325.0000 mg | ORAL_TABLET | ORAL | Status: DC | PRN

## 2022-07-02 MED ORDER — SODIUM CHLORIDE (PF) 0.9 % IJ SOLN
INTRAMUSCULAR | Status: AC
  Filled 2022-07-02: qty 10

## 2022-07-02 MED ORDER — HYDROCORTISONE 10 MG PO TABS: 30.0000 mg | ORAL_TABLET | ORAL | Status: DC

## 2022-07-02 MED ORDER — MORPHINE SULFATE (PF) 2 MG/ML IV SOLN: 1.0000 mg | INTRAVENOUS | Status: DC | PRN

## 2022-07-02 MED ORDER — FENTANYL CITRATE PF 50 MCG/ML IJ SOSY: 25.0000 ug | PREFILLED_SYRINGE | INTRAMUSCULAR | Status: DC | PRN

## 2022-07-02 MED ORDER — MEPERIDINE HCL 50 MG/ML IJ SOLN: 6.2500 mg | INTRAMUSCULAR | Status: DC | PRN

## 2022-07-02 MED ORDER — ONDANSETRON HCL 4 MG/2ML IJ SOLN: 4.0000 mg | Freq: Four times a day (QID) | INTRAMUSCULAR | Status: DC | PRN

## 2022-07-02 MED ORDER — PROPOFOL 500 MG/50ML IV EMUL
INTRAVENOUS | Status: DC | PRN
  Administered 2022-07-02: 25 ug/kg/min via INTRAVENOUS

## 2022-07-02 MED ORDER — METOCLOPRAMIDE HCL 5 MG/ML IJ SOLN: 5.0000 mg | Freq: Three times a day (TID) | INTRAMUSCULAR | Status: DC | PRN

## 2022-07-02 MED ORDER — METHOCARBAMOL 500 MG IVPB - SIMPLE MED: 500.0000 mg | Freq: Four times a day (QID) | INTRAVENOUS | Status: DC | PRN

## 2022-07-02 MED ORDER — SODIUM CHLORIDE (PF) 0.9 % IJ SOLN
INTRAMUSCULAR | Status: AC
  Filled 2022-07-02: qty 50

## 2022-07-02 MED ORDER — ACETAMINOPHEN 500 MG PO TABS
1000.0000 mg | ORAL_TABLET | Freq: Four times a day (QID) | ORAL | Status: DC
  Administered 2022-07-02 – 2022-07-03 (×3): 1000 mg via ORAL
  Filled 2022-07-02 (×3): qty 2

## 2022-07-02 MED ORDER — TRANEXAMIC ACID-NACL 1000-0.7 MG/100ML-% IV SOLN
1000.0000 mg | INTRAVENOUS | Status: AC
  Administered 2022-07-02: 1000 mg via INTRAVENOUS
  Filled 2022-07-02: qty 100

## 2022-07-02 MED ORDER — OXYCODONE HCL 5 MG PO TABS
5.0000 mg | ORAL_TABLET | ORAL | Status: DC | PRN
  Administered 2022-07-02 (×2): 5 mg via ORAL
  Administered 2022-07-02 – 2022-07-03 (×3): 10 mg via ORAL
  Filled 2022-07-02 (×3): qty 2
  Filled 2022-07-02: qty 1
  Filled 2022-07-02: qty 2
  Filled 2022-07-02: qty 1

## 2022-07-02 MED ORDER — BISACODYL 10 MG RE SUPP: 10.0000 mg | Freq: Every day | RECTAL | Status: DC | PRN

## 2022-07-02 MED ORDER — BUPIVACAINE LIPOSOME 1.3 % IJ SUSP
INTRAMUSCULAR | Status: DC | PRN
  Administered 2022-07-02: 20 mL

## 2022-07-02 MED ORDER — FLEET ENEMA 7-19 GM/118ML RE ENEM: 1.0000 | ENEMA | Freq: Once | RECTAL | Status: DC | PRN

## 2022-07-02 MED ORDER — DEXMEDETOMIDINE HCL IN NACL 80 MCG/20ML IV SOLN
INTRAVENOUS | Status: DC | PRN
  Administered 2022-07-02 (×2): 4 ug via BUCCAL

## 2022-07-02 MED ORDER — FENTANYL CITRATE PF 50 MCG/ML IJ SOSY
PREFILLED_SYRINGE | INTRAMUSCULAR | Status: AC
  Filled 2022-07-02: qty 2

## 2022-07-02 MED ORDER — DOCUSATE SODIUM 100 MG PO CAPS
100.0000 mg | ORAL_CAPSULE | Freq: Two times a day (BID) | ORAL | Status: DC
  Administered 2022-07-02 – 2022-07-03 (×2): 100 mg via ORAL
  Filled 2022-07-02 (×2): qty 1

## 2022-07-02 MED ORDER — METHOCARBAMOL 500 MG PO TABS: 500.0000 mg | ORAL_TABLET | Freq: Four times a day (QID) | ORAL | Status: DC | PRN

## 2022-07-02 MED ORDER — OXYCODONE HCL 5 MG PO TABS: 5.0000 mg | ORAL_TABLET | Freq: Once | ORAL | Status: DC | PRN

## 2022-07-02 MED ORDER — ONDANSETRON HCL 4 MG/2ML IJ SOLN
INTRAMUSCULAR | Status: DC | PRN
  Administered 2022-07-02: 4 mg via INTRAVENOUS

## 2022-07-02 MED ORDER — ORAL CARE MOUTH RINSE: 15.0000 mL | Freq: Once | OROMUCOSAL | Status: AC

## 2022-07-02 MED ORDER — HYDROCORTISONE 20 MG PO TABS
30.0000 mg | ORAL_TABLET | Freq: Every day | ORAL | Status: DC
  Administered 2022-07-03: 30 mg via ORAL
  Filled 2022-07-02: qty 1

## 2022-07-02 MED ORDER — PHENOL 1.4 % MT LIQD: 1.0000 | OROMUCOSAL | Status: DC | PRN

## 2022-07-02 SURGICAL SUPPLY — 54 items
ATTUNE PS FEM LT SZ 4 CEM KNEE (Femur) IMPLANT
ATTUNE PSRP INSR SZ4 7 KNEE (Insert) IMPLANT
BAG COUNTER SPONGE SURGICOUNT (BAG) IMPLANT
BAG ZIPLOCK 12X15 (MISCELLANEOUS) ×1 IMPLANT
BASEPLATE TIBIAL ROTATING SZ 4 (Knees) IMPLANT
BLADE SAG 18X100X1.27 (BLADE) ×1 IMPLANT
BLADE SAW SGTL 11.0X1.19X90.0M (BLADE) ×1 IMPLANT
BNDG ELASTIC 6INX 5YD STR LF (GAUZE/BANDAGES/DRESSINGS) ×1 IMPLANT
BNDG ELASTIC 6X5.8 VLCR STR LF (GAUZE/BANDAGES/DRESSINGS) IMPLANT
BOWL SMART MIX CTS (DISPOSABLE) ×1 IMPLANT
CEMENT HV SMART SET (Cement) ×2 IMPLANT
COVER SURGICAL LIGHT HANDLE (MISCELLANEOUS) ×1 IMPLANT
CUFF TOURN SGL QUICK 34 (TOURNIQUET CUFF) ×1
CUFF TRNQT CYL 34X4.125X (TOURNIQUET CUFF) ×1 IMPLANT
DRAPE INCISE IOBAN 66X45 STRL (DRAPES) ×1 IMPLANT
DRAPE U-SHAPE 47X51 STRL (DRAPES) ×1 IMPLANT
DRSG AQUACEL AG ADV 3.5X10 (GAUZE/BANDAGES/DRESSINGS) ×1 IMPLANT
DURAPREP 26ML APPLICATOR (WOUND CARE) ×1 IMPLANT
ELECT REM PT RETURN 15FT ADLT (MISCELLANEOUS) ×1 IMPLANT
GLOVE BIO SURGEON STRL SZ 6.5 (GLOVE) IMPLANT
GLOVE BIO SURGEON STRL SZ7.5 (GLOVE) IMPLANT
GLOVE BIO SURGEON STRL SZ8 (GLOVE) ×1 IMPLANT
GLOVE BIOGEL PI IND STRL 6.5 (GLOVE) IMPLANT
GLOVE BIOGEL PI IND STRL 7.0 (GLOVE) IMPLANT
GLOVE BIOGEL PI IND STRL 8 (GLOVE) ×1 IMPLANT
GOWN STRL REUS W/ TWL LRG LVL3 (GOWN DISPOSABLE) ×1 IMPLANT
GOWN STRL REUS W/ TWL XL LVL3 (GOWN DISPOSABLE) IMPLANT
GOWN STRL REUS W/TWL LRG LVL3 (GOWN DISPOSABLE) ×1
GOWN STRL REUS W/TWL XL LVL3 (GOWN DISPOSABLE)
HANDPIECE INTERPULSE COAX TIP (DISPOSABLE) ×1
HOLDER FOLEY CATH W/STRAP (MISCELLANEOUS) IMPLANT
IMMOBILIZER KNEE 20 (SOFTGOODS) ×1
IMMOBILIZER KNEE 20 THIGH 36 (SOFTGOODS) ×1 IMPLANT
IMMOBILIZER KNEE 22 UNIV (SOFTGOODS) IMPLANT
KIT TURNOVER KIT A (KITS) IMPLANT
MANIFOLD NEPTUNE II (INSTRUMENTS) ×1 IMPLANT
NS IRRIG 1000ML POUR BTL (IV SOLUTION) ×1 IMPLANT
PACK TOTAL KNEE CUSTOM (KITS) ×1 IMPLANT
PADDING CAST COTTON 6X4 STRL (CAST SUPPLIES) ×2 IMPLANT
PATELLA MEDIAL ATTUN 35MM KNEE (Knees) IMPLANT
PIN STEINMAN FIXATION KNEE (PIN) IMPLANT
PROTECTOR NERVE ULNAR (MISCELLANEOUS) ×1 IMPLANT
SET HNDPC FAN SPRY TIP SCT (DISPOSABLE) ×1 IMPLANT
SPIKE FLUID TRANSFER (MISCELLANEOUS) ×1 IMPLANT
STRIP CLOSURE SKIN 1/2X4 (GAUZE/BANDAGES/DRESSINGS) ×2 IMPLANT
SUT MNCRL AB 4-0 PS2 18 (SUTURE) ×1 IMPLANT
SUT STRATAFIX 0 PDS 27 VIOLET (SUTURE) ×1
SUT VIC AB 2-0 CT1 27 (SUTURE) ×3
SUT VIC AB 2-0 CT1 TAPERPNT 27 (SUTURE) ×3 IMPLANT
SUTURE STRATFX 0 PDS 27 VIOLET (SUTURE) ×1 IMPLANT
TRAY FOLEY MTR SLVR 16FR STAT (SET/KITS/TRAYS/PACK) ×1 IMPLANT
TUBE SUCTION HIGH CAP CLEAR NV (SUCTIONS) ×1 IMPLANT
WATER STERILE IRR 1000ML POUR (IV SOLUTION) ×2 IMPLANT
WRAP KNEE MAXI GEL POST OP (GAUZE/BANDAGES/DRESSINGS) ×1 IMPLANT

## 2022-07-02 NOTE — Progress Notes (Signed)
Orthopedic Tech Progress Note Patient Details:  Krystal Swanson 02/21/1951 [id number]  CPM Left Knee CPM Left Knee: On Left Knee Flexion (Degrees): 40 Left Knee Extension (Degrees): 10  Post Interventions Patient Tolerated: Well  [place] 07/02/2022, 1:40 PM

## 2022-07-02 NOTE — Transfer of Care (Signed)
Immediate Anesthesia Transfer of Care Note  Patient: Krystal Swanson  Procedure(s) Performed: TOTAL KNEE ARTHROPLASTY (Left: Knee)  Patient Location: PACU  Anesthesia Type:Spinal  Level of Consciousness: awake, alert , oriented, and patient cooperative  Airway & Oxygen Therapy: Patient Spontanous Breathing and Patient connected to face mask oxygen  Post-op Assessment: Report given to RN and Post -op Vital signs reviewed and stable  Post vital signs: Reviewed and stable  Last Vitals:  Vitals Value Taken Time  BP    Temp    Pulse    Resp    SpO2      Last Pain:  Vitals:   07/02/22 1107  TempSrc:   PainSc: 0-No pain      Patients Stated Pain Goal: 5 ([phone] [id number])  Complications: No notable events documented.

## 2022-07-02 NOTE — Anesthesia Preprocedure Evaluation (Addendum)
Anesthesia Evaluation  Patient identified by MRN, date of birth, ID band Patient awake    Reviewed: Allergy & Precautions, NPO status , Patient's Chart, lab work & pertinent test results  Airway Mallampati: II  TM Distance: >3 FB Neck ROM: Full    Dental no notable dental hx. (+) Teeth Intact   Pulmonary former smoker   Pulmonary exam normal breath sounds clear to auscultation       Cardiovascular Exercise Tolerance: Good Normal cardiovascular exam Rhythm:Regular Rate:Normal     Neuro/Psych    Depression       GI/Hepatic Neg liver ROS,GERD  ,,  Endo/Other  Hypothyroidism  Hx of Addisons Dz  Stress dose Steroids w 2x ('30mg'$ ) Hydrocortisone post op per endocrinologist  Renal/GU      Musculoskeletal  (+) Arthritis , Osteoarthritis,    Abdominal   Peds  Hematology   Anesthesia Other Findings   Reproductive/Obstetrics                              Anesthesia Physical Anesthesia Plan  ASA: 3  Anesthesia Plan: Spinal, MAC and Regional   Post-op Pain Management: Regional block*   Induction: Intravenous  PONV Risk Score and Plan: Treatment may vary due to age or medical condition, Ondansetron, Dexamethasone, TIVA and Midazolam  Airway Management Planned: Simple Face Mask and Natural Airway  Additional Equipment: None  Intra-op Plan:   Post-operative Plan:   Informed Consent: I have reviewed the patients History and Physical, chart, labs and discussed the procedure including the risks, benefits and alternatives for the proposed anesthesia with the patient or authorized representative who has indicated his/her understanding and acceptance.     Dental advisory given  Plan Discussed with: CRNA and Anesthesiologist  Anesthesia Plan Comments: (See PAT note 09/16/18, Konrad Felix, PA-C, instructions per endocrinologist outlined  50 mg hydrocortisone IV preop  PAcu 30 mg  Will  discuss needs of Steriods with staff)        Anesthesia Quick Evaluation

## 2022-07-02 NOTE — Anesthesia Procedure Notes (Signed)
Anesthesia Regional Block: Adductor canal block   Pre-Anesthetic Checklist: , timeout performed,  Correct Patient, Correct Site, Correct Laterality,  Correct Procedure, Correct Position, site marked,  Risks and benefits discussed,  Surgical consent,  Pre-op evaluation,  At surgeon's request and post-op pain management  Laterality: Left  Prep: chloraprep       Needles:  Injection technique: Single-shot  Needle Type: Echogenic Stimulator Needle     Needle Length: 5cm  Needle Gauge: 22     Additional Needles:   Procedures:, nerve stimulator,,, ultrasound used (permanent image in chart),,    Narrative:  Start time: 07/02/2022 11:00 AM End time: 07/02/2022 11:09 AM Injection made incrementally with aspirations every 5 mL.  Performed by: Personally  Anesthesiologist: Janeece Riggers, MD  Additional Notes: Functioning IV was confirmed and monitors were applied.  A 53m 22ga Arrow echogenic stimulator needle was used. Sterile prep and drape,hand hygiene and sterile gloves were used. Ultrasound guidance: relevant anatomy identified, needle position confirmed, local anesthetic spread visualized around nerve(s)., vascular puncture avoided.  Image printed for medical record. Negative aspiration and negative test dose prior to incremental administration of local anesthetic. The patient tolerated the procedure well.

## 2022-07-02 NOTE — Progress Notes (Signed)
Orthopedic Tech Progress Note Patient Details:  Krystal Swanson July 02, 1950 [id number]  CPM Left Knee CPM Left Knee: Off Left Knee Flexion (Degrees):  (1600) Left Knee Extension (Degrees): 10  Post Interventions Patient Tolerated: Well  Vernona Rieger 07/02/2022, 5:39 PM

## 2022-07-02 NOTE — Anesthesia Procedure Notes (Signed)
Spinal  Patient location during procedure: OR Start time: 07/02/2022 11:41 AM End time: 07/02/2022 11:46 AM Reason for block: surgical anesthesia Staffing Performed: resident/CRNA  Resident/CRNA: Garrel Ridgel, CRNA Performed by: Garrel Ridgel, CRNA Authorized by: Janeece Riggers, MD   Preanesthetic Checklist Completed: patient identified, IV checked, site marked, risks and benefits discussed, surgical consent, monitors and equipment checked, pre-op evaluation and timeout performed Spinal Block Patient position: sitting Prep: DuraPrep Patient monitoring: heart rate, cardiac monitor, continuous pulse ox and blood pressure Approach: midline Location: L4-5 Injection technique: single-shot Needle Needle type: Pencan  Needle gauge: 24 G Needle length: 9 cm Needle insertion depth: 4 cm Assessment Sensory level: T4 Events: CSF return

## 2022-07-02 NOTE — Discharge Instructions (Addendum)
Krystal Arabian, MD Total Joint Specialist [facility] [place] [address], [address] [place], [place] [address] [phone]  TOTAL KNEE REPLACEMENT POSTOPERATIVE DIRECTIONS    Knee Rehabilitation, Guidelines Following Surgery  Results after knee surgery are often greatly improved when you follow the exercise, range of motion and muscle strengthening exercises prescribed by your doctor. Safety measures are also important to protect the knee from further injury. If any of these exercises cause you to have increased pain or swelling in your knee joint, decrease the amount until you are comfortable again and slowly increase them. If you have problems or questions, call your caregiver or physical therapist for advice.   BLOOD CLOT PREVENTION Take '81mg'$  Aspirin two times a day for three weeks following surgery. Then take one 81 mg Aspirin once a day for three weeks. Then discontinue Aspirin. You may resume your vitamins/supplements upon discharge from the hospital. Do not take any NSAIDs (Advil, Aleve, Ibuprofen, Meloxicam, etc.) for 3 weeks.  HOME CARE INSTRUCTIONS  Remove items at home which could result in a fall. This includes throw rugs or furniture in walking pathways.  ICE to the affected knee as much as tolerated. Icing helps control swelling. If the swelling is well controlled you will be more comfortable and rehab easier. Continue to use ice on the knee for pain and swelling from surgery. You may notice swelling that will progress down to the foot and ankle. This is normal after surgery. Elevate the leg when you are not up walking on it.    Continue to use the breathing machine which will help keep your temperature down. It is common for your temperature to cycle up and down following surgery, especially at night when you are not up moving around and exerting yourself. The breathing machine keeps your lungs expanded and your temperature down. Do not place pillow under the  operative knee, focus on keeping the knee straight while resting  DIET You may resume your previous home diet once you are discharged from the hospital.  DRESSING / WOUND CARE / SHOWERING Keep your bulky bandage on for 2 days. On the third post-operative day you may remove the Ace bandage and gauze. There is a waterproof adhesive bandage on your skin which will stay in place until your first follow-up appointment. Once you remove this you will not need to place another bandage You may begin showering 3 days following surgery, but do not submerge the incision under water.  ACTIVITY For the first 5 days, the key is rest and control of pain and swelling Do your home exercises twice a day starting on post-operative day 3. On the days you go to physical therapy, just do the home exercises once that day. You should rest, ice and elevate the leg for 50 minutes out of every hour. Get up and walk/stretch for 10 minutes per hour. After 5 days you can increase your activity slowly as tolerated. Walk with your walker as instructed. Use the walker until you are comfortable transitioning to a cane. Walk with the cane in the opposite hand of the operative leg. You may discontinue the cane once you are comfortable and walking steadily. Avoid periods of inactivity such as sitting longer than an hour when not asleep. This helps prevent blood clots.  You may discontinue the knee immobilizer once you are able to perform a straight leg raise while lying down. You may resume a sexual relationship in one month or when given the OK by your  doctor.  You may return to work once you are cleared by your doctor.  Do not drive a car for 6 weeks or until released by your surgeon.  Do not drive while taking narcotics.  TED HOSE STOCKINGS Wear the elastic stockings on both legs for three weeks following surgery during the day. You may remove them at night for sleeping.  WEIGHT BEARING Weight bearing as tolerated with assist  device (walker, cane, etc) as directed, use it as long as suggested by your surgeon or therapist, typically at least 4-6 weeks.  POSTOPERATIVE CONSTIPATION PROTOCOL Constipation - defined medically as fewer than three stools per week and severe constipation as less than one stool per week.  One of the most common issues patients have following surgery is constipation.  Even if you have a regular bowel pattern at home, your normal regimen is likely to be disrupted due to multiple reasons following surgery.  Combination of anesthesia, postoperative narcotics, change in appetite and fluid intake all can affect your bowels.  In order to avoid complications following surgery, here are some recommendations in order to help you during your recovery period.  Colace (docusate) - Pick up an over-the-counter form of Colace or another stool softener and take twice a day as long as you are requiring postoperative pain medications.  Take with a full glass of water daily.  If you experience loose stools or diarrhea, hold the colace until you stool forms back up. If your symptoms do not get better within 1 week or if they get worse, check with your doctor. Dulcolax (bisacodyl) - Pick up over-the-counter and take as directed by the product packaging as needed to assist with the movement of your bowels.  Take with a full glass of water.  Use this product as needed if not relieved by Colace only.  MiraLax (polyethylene glycol) - Pick up over-the-counter to have on hand. MiraLax is a solution that will increase the amount of water in your bowels to assist with bowel movements.  Take as directed and can mix with a glass of water, juice, soda, coffee, or tea. Take if you go more than two days without a movement. Do not use MiraLax more than once per day. Call your doctor if you are still constipated or irregular after using this medication for 7 days in a row.  If you continue to have problems with postoperative constipation,  please contact the office for further assistance and recommendations.  If you experience "the worst abdominal pain ever" or develop nausea or vomiting, please contact the office immediatly for further recommendations for treatment.  ITCHING If you experience itching with your medications, try taking only a single pain pill, or even half a pain pill at a time.  You can also use Benadryl over the counter for itching or also to help with sleep.   MEDICATIONS See your medication summary on the "After Visit Summary" that the nursing staff will review with you prior to discharge.  You may have some home medications which will be placed on hold until you complete the course of blood thinner medication.  It is important for you to complete the blood thinner medication as prescribed by your surgeon.  Continue your approved medications as instructed at time of discharge.  PRECAUTIONS If you experience chest pain or shortness of breath - call 911 immediately for transfer to the hospital emergency department.  If you develop a fever greater that 101 F, purulent drainage from wound, increased redness or  drainage from wound, foul odor from the wound/dressing, or calf pain - CONTACT YOUR SURGEON.                                                   FOLLOW-UP APPOINTMENTS Make sure you keep all of your appointments after your operation with your surgeon and caregivers. You should call the office at the above phone number and make an appointment for approximately two weeks after the date of your surgery or on the date instructed by your surgeon outlined in the "After Visit Summary".  RANGE OF MOTION AND STRENGTHENING EXERCISES  Rehabilitation of the knee is important following a knee injury or an operation. After just a few days of immobilization, the muscles of the thigh which control the knee become weakened and shrink (atrophy). Knee exercises are designed to build up the tone and strength of the thigh muscles and to  improve knee motion. Often times heat used for twenty to thirty minutes before working out will loosen up your tissues and help with improving the range of motion but do not use heat for the first two weeks following surgery. These exercises can be done on a training (exercise) mat, on the floor, on a table or on a bed. Use what ever works the best and is most comfortable for you Knee exercises include:  Leg Lifts - While your knee is still immobilized in a splint or cast, you can do straight leg raises. Lift the leg to 60 degrees, hold for 3 sec, and slowly lower the leg. Repeat 10-20 times 2-3 times daily. Perform this exercise against resistance later as your knee gets better.  Quad and Hamstring Sets - Tighten up the muscle on the front of the thigh (Quad) and hold for 5-10 sec. Repeat this 10-20 times hourly. Hamstring sets are done by pushing the foot backward against an object and holding for 5-10 sec. Repeat as with quad sets.  Leg Slides: Lying on your back, slowly slide your foot toward your buttocks, bending your knee up off the floor (only go as far as is comfortable). Then slowly slide your foot back down until your leg is flat on the floor again. Angel Wings: Lying on your back spread your legs to the side as far apart as you can without causing discomfort.  A rehabilitation program following serious knee injuries can speed recovery and prevent re-injury in the future due to weakened muscles. Contact your doctor or a physical therapist for more information on knee rehabilitation.   POST-OPERATIVE OPIOID TAPER INSTRUCTIONS: It is important to wean off of your opioid medication as soon as possible. If you do not need pain medication after your surgery it is ok to stop day one. Opioids include: Codeine, Hydrocodone(Norco, Vicodin), Oxycodone(Percocet, oxycontin) and hydromorphone amongst others.  Long term and even short term use of opiods can cause: Increased pain  response Dependence Constipation Depression Respiratory depression And more.  Withdrawal symptoms can include Flu like symptoms Nausea, vomiting And more Techniques to manage these symptoms Hydrate well Eat regular healthy meals Stay active Use relaxation techniques(deep breathing, meditating, yoga) Do Not substitute Alcohol to help with tapering If you have been on opioids for less than two weeks and do not have pain than it is ok to stop all together.  Plan to wean off of opioids This plan  should start within one week post op of your joint replacement. Maintain the same interval or time between taking each dose and first decrease the dose.  Cut the total daily intake of opioids by one tablet each day Next start to increase the time between doses. The last dose that should be eliminated is the evening dose.   IF YOU ARE TRANSFERRED TO A SKILLED REHAB FACILITY If the patient is transferred to a skilled rehab facility following release from the hospital, a list of the current medications will be sent to the facility for the patient to continue.  When discharged from the skilled rehab facility, please have the facility set up the patient's [place] prior to being released. Also, the skilled facility will be responsible for providing the patient with their medications at time of release from the facility to include their pain medication, the muscle relaxants, and their blood thinner medication. If the patient is still at the rehab facility at time of the two week follow up appointment, the skilled rehab facility will also need to assist the patient in arranging follow up appointment in our office and any transportation needs.  MAKE SURE YOU:  Understand these instructions.  Get help right away if you are not doing well or get worse.   DENTAL ANTIBIOTICS:  In most cases prophylactic antibiotics for Dental procdeures after total joint surgery are not  necessary.  Exceptions are as follows:  1. History of prior total joint infection  2. Severely immunocompromised (Organ Transplant, cancer chemotherapy, Rheumatoid biologic meds such as [place])  3. Poorly controlled diabetes (A1C &gt; 8.0, blood glucose over 200)  If you have one of these conditions, contact your surgeon for an antibiotic prescription, prior to your dental procedure.    Pick up stool softner and laxative for home use following surgery while on pain medications. Do not submerge incision under water. Please use good hand washing techniques while changing dressing each day. May shower starting three days after surgery. Please use a clean towel to pat the incision dry following showers. Continue to use ice for pain and swelling after surgery. Do not use any lotions or creams on the incision until instructed by your surgeon.

## 2022-07-02 NOTE — Evaluation (Signed)
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 Pt has pmh including but not limited to: trochanteric bursitis of R hip, Addison's dz, hypothyroidism,  and R THA, AA (2020).  Clinical Impression  Krystal Swanson is a 72 y.o. female POD 0 s/p L TKA. Patient reports IND with mobility at baseline. Patient is now limited by functional impairments (see PT problem list below) and requires min guard for bed mobility and min guard with cues for transfers. Patient was able to ambulate 52 feet with RW and min guard level of assist. Patient instructed in exercise to facilitate ROM and circulation to manage edema. Patient will benefit from continued skilled PT interventions to address impairments and progress towards PLOF. Acute PT will follow to progress mobility and stair training in preparation for safe discharge home.        Recommendations for follow up therapy are one component of a multi-disciplinary discharge planning process, led by the attending physician.  Recommendations may be updated based on patient status, additional functional criteria and insurance authorization.  Follow Up Recommendations Other (comment) (p reports will transiton to OPPT)      Assistance Recommended at Discharge Frequent or constant Supervision/Assistance  Patient can return home with the following  A little help with walking and/or transfers;A little help with bathing/dressing/bathroom;Assistance with cooking/housework;Assist for transportation;Help with stairs or ramp for entrance    Equipment Recommendations None recommended by PT (pt has DME as reported above)  Recommendations for Other Services       Functional Status Assessment Patient has had a recent decline in their functional status and demonstrates the  ability to make significant improvements in function in a reasonable and predictable amount of time.     Precautions / Restrictions Precautions Precautions: Knee;Fall Restrictions Weight Bearing Restrictions: No      Mobility  Bed Mobility Overal bed mobility: Needs Assistance Bed Mobility: Supine to Sit     Supine to sit: Min guard     General bed mobility comments: HOB slightly elevated and use of L bed rail    Transfers Overall transfer level: Needs assistance Equipment used: Rolling walker (2 wheels) Transfers: Sit to/from Stand Sit to Stand: Min guard           General transfer comment: cues for safety and proper UE and LE placement (pt reported hx of orthostatic hypotension and assessed BP in sitting 113/50 and in standing 105/54 and pt asymptomatic)    Ambulation/Gait Ambulation/Gait assistance: Min guard Gait Distance (Feet): 52 Feet Assistive device: Rolling walker (2 wheels) Gait Pattern/deviations: Step-to pattern, Trunk flexed Gait velocity: decreased        Stairs            Wheelchair Mobility    Modified Rankin (Stroke Patients Only)       Balance Overall balance assessment: Needs assistance Sitting-balance support: Feet unsupported, Single extremity supported Sitting balance-Leahy Scale: Fair     Standing balance support: Reliant on assistive device for balance Standing balance-Leahy Scale: Poor                               Pertinent Vitals/Pain Pain Assessment Pain Assessment: 0-10 Pain Score: 7  Pain Location: L knee LBP Pain Descriptors / Indicators: Aching, Guarding Pain Intervention(s): Limited activity within  patient's tolerance, Monitored during session, Patient requesting pain meds-RN notified, Ice applied    Home Living Family/patient expects to be discharged to:: Private residence Living Arrangements: Spouse/significant other Available Help at Discharge: Family Type of Home: House Home Access:  Stairs to enter Entrance Stairs-Rails: None Entrance Stairs-Number of Steps: 1 (short threshold)   Home Layout: One level Home Equipment: Conservation officer, nature (2 wheels);Rollator (4 wheels)      Prior Function Prior Level of Function : Independent/Modified Independent;Driving             Mobility Comments: IND with all ADLs, self care taks, IADLs, driving and no AD       Hand Dominance        Extremity/Trunk Assessment   Lower Extremity Assessment Lower Extremity Assessment: LLE deficits/detail LLE Deficits / Details: ankle DF/PF 5/5 LLE Sensation: WNL            Communication   Communication: No difficulties  Cognition Arousal/Alertness: Awake/alert Behavior During Therapy: WFL for tasks assessed/performed Overall Cognitive Status: Within Functional Limits for tasks assessed                                          General Comments      Exercises Total Joint Exercises Ankle Circles/Pumps: AROM, 20 reps, Both   Assessment/Plan    PT Assessment Patient needs continued PT services  PT Problem List Decreased strength;Decreased range of motion;Decreased activity tolerance;Decreased balance;Decreased mobility;Decreased cognition;Decreased coordination;Decreased knowledge of use of DME;Pain       PT Treatment Interventions DME instruction;Gait training;Stair training;Functional mobility training;Therapeutic activities;Therapeutic exercise;Balance training;Neuromuscular re-education;Patient/family education;Modalities    PT Goals (Current goals can be found in the Care Plan section)  Acute Rehab PT Goals Patient Stated Goal: to be able to move more easily PT Goal Formulation: With patient Time For Goal Achievement: 07/16/22 Potential to Achieve Goals: Good    Frequency 7X/week     Co-evaluation               AM-PAC PT "6 Clicks" Mobility  Outcome Measure Help needed turning from your back to your side while in a flat bed without  using bedrails?: A Little Help needed moving from lying on your back to sitting on the side of a flat bed without using bedrails?: A Little Help needed moving to and from a bed to a chair (including a wheelchair)?: A Little Help needed standing up from a chair using your arms (e.g., wheelchair or bedside chair)?: A Little Help needed to walk in hospital room?: A Little Help needed climbing 3-5 steps with a railing? : Total 6 Click Score: 16    End of Session Equipment Utilized During Treatment: Gait belt Activity Tolerance: Patient limited by pain Patient left: in chair;with call bell/phone within reach;with family/visitor present Nurse Communication: Mobility status;Patient requests pain meds PT Visit Diagnosis: Unsteadiness on feet (R26.81);Other abnormalities of gait and mobility (R26.89);Repeated falls (R29.6);Muscle weakness (generalized) (M62.81);Pain Pain - Right/Left: Left Pain - part of body: Knee    Time: [id number] PT Time Calculation (min) (ACUTE ONLY): 49 min   Charges:   PT Evaluation $PT Eval Low Complexity: 1 Low PT Treatments $Gait Training: 8-22 mins $Therapeutic Activity: 8-22 mins        Baird Lyons, PT   Adair Patter 07/02/2022, 5:31 PM

## 2022-07-02 NOTE — Op Note (Signed)
OPERATIVE REPORT-TOTAL KNEE ARTHROPLASTY   Pre-operative diagnosis- Osteoarthritis  Left knee(s)  Post-operative diagnosis- Osteoarthritis Left knee(s)  Procedure-  Left  Total Knee Arthroplasty  Surgeon- Dione Plover. Marrisa Kimber, MD  Assistant- Molli Barrows, PA-C   Anesthesia-   Adductor canal block and spinal  EBL-20 mL   Drains None  Tourniquet time-  Total Tourniquet Time Documented: Thigh (Left) - 31 minutes Total: Thigh (Left) - 31 minutes     Complications- None  Condition-PACU - hemodynamically stable.   Brief Clinical Note  Krystal Swanson is a 72 y.o. year old female with end stage OA of her left knee with progressively worsening pain and dysfunction. She has constant pain, with activity and at rest and significant functional deficits with difficulties even with ADLs. She has had extensive non-op management including analgesics, injections of cortisone and viscosupplements, and home exercise program, but remains in significant pain with significant dysfunction. Radiographs show bone on bone arthritis medial and patellofemoral. She presents now for left Total Knee Arthroplasty.     Procedure in detail---   The patient is brought into the operating room and positioned supine on the operating table. After successful administration of  Adductor canal block and spinal,   a tourniquet is placed high on the  Left high(s) and the lower extremity is prepped and draped in the usual sterile fashion. Time out is performed by the operating team and then the left lower extremity is wrapped in Esmarch, knee flexed and the tourniquet inflated to 300 mmHg.       A midline incision is made with a ten blade through the subcutaneous tissue to the level of the extensor mechanism. A fresh blade is used to make a medial parapatellar arthrotomy. Soft tissue over the proximal medial tibia is subperiosteally elevated to the joint line with a knife and into the semimembranosus bursa with a Cobb  elevator. Soft tissue over the proximal lateral tibia is elevated with attention being paid to avoiding the patellar tendon on the tibial tubercle. The patella is everted, knee flexed 90 degrees and the ACL and PCL are removed. Findings are bone on bone medial and patellofemoral with large global osteophytes        The drill is used to create a starting hole in the distal femur and the canal is thoroughly irrigated with sterile saline to remove the fatty contents. The 5 degree Left  valgus alignment guide is placed into the femoral canal and the distal femoral cutting block is pinned to remove 9 mm off the distal femur. Resection is made with an oscillating saw.      The tibia is subluxed forward and the menisci are removed. The extramedullary alignment guide is placed referencing proximally at the medial aspect of the tibial tubercle and distally along the second metatarsal axis and tibial crest. The block is pinned to remove 77m off the more deficient medial  side. Resection is made with an oscillating saw. Size 4is the most appropriate size for the tibia and the proximal tibia is prepared with the modular drill and keel punch for that size.      The femoral sizing guide is placed and size 4 is most appropriate. Rotation is marked off the epicondylar axis and confirmed by creating a rectangular flexion gap at 90 degrees. The size 4 cutting block is pinned in this rotation and the anterior, posterior and chamfer cuts are made with the oscillating saw. The intercondylar block is then placed and that cut is made.  Trial size 4 tibial component, trial size 4 posterior stabilized femur and a 8  mm posterior stabilized rotating platform insert trial is placed. Full extension is achieved with excellent varus/valgus and anterior/posterior balance throughout full range of motion. The patella is everted and thickness measured to be 25  mm. Free hand resection is taken to 15 mm, a 35 template is placed, lug holes are  drilled, trial patella is placed, and it tracks normally. Osteophytes are removed off the posterior femur with the trial in place. All trials are removed and the cut bone surfaces prepared with pulsatile lavage. Cement is mixed and once ready for implantation, the size 4 tibial implant, size  4 posterior stabilized femoral component, and the size 35 patella are cemented in place and the patella is held with the clamp. The trial insert is placed and the knee held in full extension. The Exparel (20 ml mixed with 60 ml saline) is injected into the extensor mechanism, posterior capsule, medial and lateral gutters and subcutaneous tissues.  All extruded cement is removed and once the cement is hard the permanent 8 mm posterior stabilized rotating platform insert is placed into the tibial tray.      The wound is copiously irrigated with saline solution and the extensor mechanism closed with # 0 Stratofix suture. The tourniquet is released for a total tourniquet time of 31  minutes. Flexion against gravity is 140 degrees and the patella tracks normally. Subcutaneous tissue is closed with 2.0 vicryl and subcuticular with running 4.0 Monocryl. The incision is cleaned and dried and steri-strips and a bulky sterile dressing are applied. The limb is placed into a knee immobilizer and the patient is awakened and transported to recovery in stable condition.      Please note that a surgical assistant was a medical necessity for this procedure in order to perform it in a safe and expeditious manner. Surgical assistant was necessary to retract the ligaments and vital neurovascular structures to prevent injury to them and also necessary for proper positioning of the limb to allow for anatomic placement of the prosthesis.   Dione Plover Marieliz Strang, MD    07/02/2022, 12:44 PM

## 2022-07-02 NOTE — Plan of Care (Signed)
  Problem: Education: Goal: Knowledge of the prescribed therapeutic regimen will improve Outcome: Progressing   Problem: Activity: Goal: Ability to avoid complications of mobility impairment will improve Outcome: Progressing   Problem: Pain Management: Goal: Pain level will decrease with appropriate interventions Outcome: Progressing   Problem: Skin Integrity: Goal: Will show signs of wound healing Outcome: Progressing   Problem: Education: Goal: Knowledge of General Education information will improve Description: Including pain rating scale, medication(s)/side effects and non-pharmacologic comfort measures Outcome: Progressing   Problem: Activity: Goal: Risk for activity intolerance will decrease Outcome: Progressing   Problem: Elimination: Goal: Will not experience complications related to bowel motility Outcome: Progressing   Problem: Pain Managment: Goal: General experience of comfort will improve Outcome: Progressing   Problem: Safety: Goal: Ability to remain free from injury will improve Outcome: Progressing

## 2022-07-02 NOTE — Interval H&P Note (Signed)
History and Physical Interval Note:  07/02/2022 9:44 AM  Krystal Swanson  has presented today for surgery, with the diagnosis of left knee osteoarthritis.  The various methods of treatment have been discussed with the patient and family. After consideration of risks, benefits and other options for treatment, the patient has consented to  Procedure(s): TOTAL KNEE ARTHROPLASTY (Left) as a surgical intervention.  The patient's history has been reviewed, patient examined, no change in status, stable for surgery.  I have reviewed the patient's chart and labs.  Questions were answered to the patient's satisfaction.     Pilar Plate Mateusz Neilan

## 2022-07-03 ENCOUNTER — Encounter (HOSPITAL_COMMUNITY): Payer: Self-pay | Admitting: Orthopedic Surgery

## 2022-07-03 DIAGNOSIS — E039 Hypothyroidism, unspecified: Secondary | ICD-10-CM | POA: Diagnosis not present

## 2022-07-03 DIAGNOSIS — Z87891 Personal history of nicotine dependence: Secondary | ICD-10-CM | POA: Diagnosis not present

## 2022-07-03 DIAGNOSIS — M169 Osteoarthritis of hip, unspecified: Secondary | ICD-10-CM | POA: Diagnosis not present

## 2022-07-03 DIAGNOSIS — M1712 Unilateral primary osteoarthritis, left knee: Secondary | ICD-10-CM | POA: Diagnosis not present

## 2022-07-03 DIAGNOSIS — Z96641 Presence of right artificial hip joint: Secondary | ICD-10-CM | POA: Diagnosis not present

## 2022-07-03 DIAGNOSIS — M179 Osteoarthritis of knee, unspecified: Secondary | ICD-10-CM | POA: Diagnosis not present

## 2022-07-03 DIAGNOSIS — Z79899 Other long term (current) drug therapy: Secondary | ICD-10-CM | POA: Diagnosis not present

## 2022-07-03 DIAGNOSIS — M7061 Trochanteric bursitis, right hip: Secondary | ICD-10-CM | POA: Diagnosis not present

## 2022-07-03 LAB — CBC
HCT: 40.7 % (ref 36.0–46.0)
Hemoglobin: 13.7 g/dL (ref 12.0–15.0)
MCH: 33.8 pg (ref 26.0–34.0)
MCHC: 33.7 g/dL (ref 30.0–36.0)
MCV: 100.5 fL — ABNORMAL HIGH (ref 80.0–100.0)
Platelets: 209 10*3/uL (ref 150–400)
RBC: 4.05 MIL/uL (ref 3.87–5.11)
RDW: 12.7 % (ref 11.5–15.5)
WBC: 12.7 10*3/uL — ABNORMAL HIGH (ref 4.0–10.5)
nRBC: 0 % (ref 0.0–0.2)

## 2022-07-03 LAB — BASIC METABOLIC PANEL
Anion gap: 5 (ref 5–15)
BUN: 17 mg/dL (ref 8–23)
CO2: 28 mmol/L (ref 22–32)
Calcium: 8.2 mg/dL — ABNORMAL LOW (ref 8.9–10.3)
Chloride: 106 mmol/L (ref 98–111)
Creatinine, Ser: 0.84 mg/dL (ref 0.44–1.00)
GFR, Estimated: >60 mL/min (ref >60)
Glucose, Bld: 151 mg/dL — ABNORMAL HIGH (ref 70–99)
Potassium: 3.3 mmol/L — ABNORMAL LOW (ref 3.5–5.1)
Sodium: 139 mmol/L (ref 135–145)

## 2022-07-03 MED ORDER — HYDROCORTISONE 20 MG PO TABS: 30.0000 mg | ORAL_TABLET | Freq: Every evening | ORAL | Status: DC

## 2022-07-03 MED ORDER — OXYCODONE HCL 5 MG PO TABS: 5.0000 mg | ORAL_TABLET | Freq: Four times a day (QID) | ORAL | 0 refills | Status: AC | PRN

## 2022-07-03 MED ORDER — HYDROCORTISONE 20 MG PO TABS: 40.0000 mg | ORAL_TABLET | Freq: Every day | ORAL | Status: DC

## 2022-07-03 MED ORDER — ASPIRIN 81 MG PO CHEW: 81.0000 mg | CHEWABLE_TABLET | Freq: Two times a day (BID) | ORAL | 0 refills | Status: AC

## 2022-07-03 MED ORDER — METHOCARBAMOL 500 MG PO TABS: 500.0000 mg | ORAL_TABLET | Freq: Four times a day (QID) | ORAL | 0 refills | Status: DC | PRN

## 2022-07-03 MED ORDER — HYDROCORTISONE 10 MG PO TABS
10.0000 mg | ORAL_TABLET | Freq: Once | ORAL | Status: AC
  Administered 2022-07-03: 10 mg via ORAL
  Filled 2022-07-03 (×2): qty 1

## 2022-07-03 NOTE — Plan of Care (Signed)
Plan of care reviewed and discussed. 

## 2022-07-03 NOTE — Discharge Summary (Signed)
Physician Discharge Summary   Patient ID: Krystal Swanson MRN: [id number] DOB/AGE: [id number] 65 y.o.  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[place])    Anxiety    Arthritis    Bilateral cataracts    Depression    Diverticulosis 2019   Noted on colonoscopy   Dyspnea    with exertion   GERD (gastroesophageal reflux disease)    History of colon polyps    Hyperlipidemia    Hypothyroidism    Osteoporosis    Pneumonia    Pre-diabetes    Seasonal allergies    Tear of left biceps muscle    Vitamin D deficiency    Discharge Diagnoses:   Principal Problem:   OA (osteoarthritis) of knee Active Problems:   Primary osteoarthritis of left knee  Estimated body mass index is 25.04 kg/m as calculated from the following:   Height as of this encounter: '4\' 11"'$  (1.499 m).   Weight as of this encounter: 56.2 kg.  Procedure:  Procedure(s) (LRB): TOTAL KNEE ARTHROPLASTY (Left)   Consults: None  HPI: Krystal Swanson is a 72 y.o. year old female with end stage OA of her left knee with progressively worsening pain and dysfunction. She has constant pain, with activity and at rest and significant functional deficits with difficulties even with ADLs. She has had extensive non-op management including analgesics, injections of cortisone and viscosupplements, and home exercise program, but remains in significant pain with significant dysfunction. Radiographs show bone on bone arthritis medial and patellofemoral. She presents now for left Total Knee Arthroplasty.  Laboratory Data: Admission on 07/02/2022, Discharged on 07/03/2022  Component Date Value Ref Range Status   WBC 07/03/2022 12.7 (H)  4.0 - 10.5 K/uL Final   RBC 07/03/2022 4.05  3.87 - 5.11 MIL/uL Final   Hemoglobin 07/03/2022 13.7  12.0 - 15.0 g/dL Final   HCT 07/03/2022 40.7  36.0 - 46.0 %  Final   MCV 07/03/2022 100.5 (H)  80.0 - 100.0 fL Final   MCH 07/03/2022 33.8  26.0 - 34.0 pg Final   MCHC 07/03/2022 33.7  30.0 - 36.0 g/dL Final   RDW 07/03/2022 12.7  11.5 - 15.5 % Final   Platelets 07/03/2022 209  150 - 400 K/uL Final   nRBC 07/03/2022 0.0  0.0 - 0.2 % Final   Performed at [facility], [place] [address] [place], [place] [address]   Sodium 07/03/2022 139  135 - 145 mmol/L Final   Potassium 07/03/2022 3.3 (L)  3.5 - 5.1 mmol/L Final   Chloride 07/03/2022 106  98 - 111 mmol/L Final   CO2 07/03/2022 28  22 - 32 mmol/L Final   Glucose, Bld 07/03/2022 151 (H)  70 - 99 mg/dL Final   Glucose reference range applies only to samples taken after fasting for at least 8 hours.   BUN 07/03/2022 17  8 - 23 mg/dL Final   Creatinine, Ser 07/03/2022 0.84  0.44 - 1.00 mg/dL Final   Calcium 07/03/2022 8.2 (L)  8.9 - 10.3 mg/dL Final   GFR, Estimated 07/03/2022 >60  >60 mL/min Final   Comment: (NOTE) Calculated using the CKD-EPI Creatinine Equation (2021)    Anion gap 07/03/2022 5  5 - 15 Final   Performed at [facility], [place] [address] [place], [place] [address]  Hospital Outpatient Visit on 06/19/2022  Component Date Value Ref  Range Status   MRSA, PCR 06/19/2022 NEGATIVE  NEGATIVE Final   Staphylococcus aureus 06/19/2022 NEGATIVE  NEGATIVE Final   Comment: (NOTE) The Xpert SA Assay (FDA approved for NASAL specimens in patients 77 years of age and older), is one component of a comprehensive surveillance program. It is not intended to diagnose infection nor to guide or monitor treatment. Performed at [facility], [place] [address], [place], [place] [address]    WBC 06/19/2022 11.6 (H)  4.0 - 10.5 K/uL Final   RBC 06/19/2022 5.01  3.87 - 5.11 MIL/uL Final   Hemoglobin 06/19/2022 16.7 (H)  12.0 - 15.0 g/dL Final   HCT 06/19/2022 50.7 (H)  36.0 - 46.0 % Final   MCV 06/19/2022 101.2 (H)  80.0 - 100.0 fL Final   MCH 06/19/2022  33.3  26.0 - 34.0 pg Final   MCHC 06/19/2022 32.9  30.0 - 36.0 g/dL Final   RDW 06/19/2022 12.8  11.5 - 15.5 % Final   Platelets 06/19/2022 238  150 - 400 K/uL Final   nRBC 06/19/2022 0.0  0.0 - 0.2 % Final   Performed at [facility], [place] [address], [place], [place] [address]   Sodium 06/19/2022 140  135 - 145 mmol/L Final   Potassium 06/19/2022 3.8  3.5 - 5.1 mmol/L Final   Chloride 06/19/2022 105  98 - 111 mmol/L Final   CO2 06/19/2022 26  22 - 32 mmol/L Final   Glucose, Bld 06/19/2022 141 (H)  70 - 99 mg/dL Final   Glucose reference range applies only to samples taken after fasting for at least 8 hours.   BUN 06/19/2022 19  8 - 23 mg/dL Final   Creatinine, Ser 06/19/2022 0.77  0.44 - 1.00 mg/dL Final   Calcium 06/19/2022 9.3  8.9 - 10.3 mg/dL Final   GFR, Estimated 06/19/2022 >60  >60 mL/min Final   Comment: (NOTE) Calculated using the CKD-EPI Creatinine Equation (2021)    Anion gap 06/19/2022 9  5 - 15 Final   Performed at [facility], [place] [address] [place], [place] [address]   Hgb A1c MFr Bld 06/19/2022 6.0 (H)  4.8 - 5.6 % Final   Comment: (NOTE) Pre diabetes:          5.7%-6.4%  Diabetes:              >6.4%  Glycemic control for   <7.0% adults with diabetes    Mean Plasma Glucose 06/19/2022 125.5  mg/dL Final   Performed at [facility], [place] [address], [place], [place] [address]   Glucose-Capillary 06/19/2022 133 (H)  70 - 99 mg/dL Final   Glucose reference range applies only to samples taken after fasting for at least 8 hours.     X-Rays:No results found.  EKG: Orders placed or performed during the hospital encounter of 06/19/22   EKG 12-LEAD   EKG 12-LEAD     Hospital Course: Krystal Swanson is a 72 y.o. who was admitted to [facility]. They were brought to the operating room on 07/02/2022 and underwent Procedure(s): TOTAL KNEE ARTHROPLASTY.  Patient tolerated the procedure well and was later  transferred to the recovery room and then to the orthopaedic floor for postoperative care. They were given PO and IV analgesics for pain control following their surgery. They were given 24 hours of postoperative antibiotics of  Anti-infectives (From admission, onward)    Start     Dose/Rate Route Frequency Ordered Stop  07/02/22 1800  ceFAZolin (ANCEF) IVPB 2g/100 mL premix        2 g 200 mL/hr over 30 Minutes Intravenous Every 6 hours 07/02/22 1435 07/03/22 0015   07/02/22 0930  ceFAZolin (ANCEF) IVPB 2g/100 mL premix        2 g 200 mL/hr over 30 Minutes Intravenous On call to O.R. 07/02/22 [id number] 07/02/22 1153     and started on DVT prophylaxis in the form of Aspirin.   PT and OT were ordered for total joint protocol. Discharge planning consulted to help with postop disposition and equipment needs.  Patient had a good night on the evening of surgery. They started to get up OOB with therapy on POD #0. Pt was seen during rounds and was ready to go home pending progress with therapy. She worked with therapy on POD #1 and was meeting her goals. Pt was discharged to home later that day in stable condition.  Diet: Regular diet Activity: WBAT Follow-up: in 2 weeks Disposition: Home Discharged Condition: stable   Discharge Instructions     Call MD / Call 911   Complete by: As directed    If you experience chest pain or shortness of breath, CALL 911 and be transported to the hospital emergency room.  If you develope a fever above 101 F, pus (white drainage) or increased drainage or redness at the wound, or calf pain, call your surgeon's office.   Change dressing   Complete by: As directed    You may remove the bulky bandage (ACE wrap and gauze) two days after surgery. You will have an adhesive waterproof bandage underneath. Leave this in place until your first follow-up appointment.   Constipation Prevention   Complete by: As directed    Drink plenty of fluids.  Prune juice may be helpful.   You may use a stool softener, such as Colace (over the counter) 100 mg twice a day.  Use MiraLax (over the counter) for constipation as needed.   Diet - low sodium heart healthy   Complete by: As directed    Do not put a pillow under the knee. Place it under the heel.   Complete by: As directed    Driving restrictions   Complete by: As directed    No driving for two weeks   Post-operative opioid taper instructions:   Complete by: As directed    POST-OPERATIVE OPIOID TAPER INSTRUCTIONS: It is important to wean off of your opioid medication as soon as possible. If you do not need pain medication after your surgery it is ok to stop day one. Opioids include: Codeine, Hydrocodone(Norco, Vicodin), Oxycodone(Percocet, oxycontin) and hydromorphone amongst others.  Long term and even short term use of opiods can cause: Increased pain response Dependence Constipation Depression Respiratory depression And more.  Withdrawal symptoms can include Flu like symptoms Nausea, vomiting And more Techniques to manage these symptoms Hydrate well Eat regular healthy meals Stay active Use relaxation techniques(deep breathing, meditating, yoga) Do Not substitute Alcohol to help with tapering If you have been on opioids for less than two weeks and do not have pain than it is ok to stop all together.  Plan to wean off of opioids This plan should start within one week post op of your joint replacement. Maintain the same interval or time between taking each dose and first decrease the dose.  Cut the total daily intake of opioids by one tablet each day Next start to increase the time between doses. The last dose  that should be eliminated is the evening dose.      TED hose   Complete by: As directed    Use stockings (TED hose) for three weeks on both leg(s).  You may remove them at night for sleeping.   Weight bearing as tolerated   Complete by: As directed       Allergies as of 07/03/2022   No  Known Allergies      Medication List     STOP taking these medications    HYDROcodone-acetaminophen 5-325 MG tablet Commonly known as: NORCO/VICODIN       TAKE these medications    aspirin 81 MG chewable tablet Chew 1 tablet (81 mg total) by mouth 2 (two) times daily for 20 days. Then take one 81 mg aspirin once a day for three weeks. Then discontinue aspirin.   CALCIUM MAGNESIUM ZINC PO Take 1 tablet by mouth daily.   cyanocobalamin 1000 MCG tablet Commonly known as: VITAMIN B12 Take 1,000 mcg by mouth daily.   famotidine 20 MG tablet Commonly known as: PEPCID Take 20 mg by mouth daily as needed for heartburn or indigestion.   Fish Oil 1000 MG Caps Take 1,000 mg by mouth 3 (three) times a week.   hydrocortisone 10 MG tablet Commonly known as: CORTEF Take 15-20 mg by mouth See admin instructions. Take 20 mg by mouth in the morning and 15 mg in the afternoon   levothyroxine 88 MCG tablet Commonly known as: SYNTHROID Take 88 mcg by mouth daily before breakfast.   loratadine 10 MG tablet Commonly known as: CLARITIN Take 10 mg by mouth daily.   methocarbamol 500 MG tablet Commonly known as: ROBAXIN Take 1 tablet (500 mg total) by mouth every 6 (six) hours as needed for muscle spasms.   OVER THE COUNTER MEDICATION Take 1 capsule by mouth every evening. Menopause Transitions   OVER THE COUNTER MEDICATION Vitamin d 3 5000 units daily   oxyCODONE 5 MG immediate release tablet Commonly known as: Oxy IR/ROXICODONE Take 1-2 tablets (5-10 mg total) by mouth every 6 (six) hours as needed for severe pain.   rosuvastatin 10 MG tablet Commonly known as: CRESTOR Take 10 mg by mouth every evening.   solifenacin 5 MG tablet Commonly known as: VESICARE Take 5 mg by mouth daily as needed (overactive bladder).   venlafaxine XR 75 MG 24 hr capsule Commonly known as: EFFEXOR-XR Take 75 mg by mouth daily.   vitamin C 1000 MG tablet Take 1,000 mg by mouth daily.    Vitamin D 125 MCG (5000 UT) Caps Take 5,000 Units by mouth daily.   vitamin E 180 MG (400 UNITS) capsule Generic drug: vitamin E Take 400 Units by mouth daily.               Durable Medical Equipment  (From admission, onward)           Start     Ordered   07/03/22 0751  For home use only DME 3 n 1  Once        07/03/22 0750              Discharge Care Instructions  (From admission, onward)           Start     Ordered   07/03/22 0000  Weight bearing as tolerated        07/03/22 0754   07/03/22 0000  Change dressing       Comments: You may remove the bulky  bandage (ACE wrap and gauze) two days after surgery. You will have an adhesive waterproof bandage underneath. Leave this in place until your first follow-up appointment.   07/03/22 0754            Follow-up Information     Gaynelle Arabian, MD Follow up in 2 week(s).   Specialty: Orthopedic Surgery Contact information: [address] [place] [address] [phone]                 Signed: R. Jaynie Bream, PA-C Orthopedic Surgery 07/03/2022, 12:44 PM

## 2022-07-03 NOTE — Progress Notes (Signed)
Physical Therapy Treatment Patient Details Name: Krystal Swanson MRN: [id number] DOB: [birth year] Today's Date: 07/03/2022   History of Present Illness 72 yo female presents to therapy s/p L TKA on 07/02/2022 due to failure of conservative measures. Pt has pmh including but not limited to: trochanteric bursitis of R hip, Addison's dz, hypothyroidism,  and R THA, AA (2020).    PT Comments    POD # 1 am session Pt just got settled into recliner from using bathroom.  So Instructed on TKR TE following HEP handout.   Will see pt again for gait before D/C.   Recommendations for follow up therapy are one component of a multi-disciplinary discharge planning process, led by the attending physician.  Recommendations may be updated based on patient status, additional functional criteria and insurance authorization.  Follow Up Recommendations  Outpatient PT     Assistance Recommended at Discharge Frequent or constant Supervision/Assistance  Patient can return home with the following A little help with walking and/or transfers;A little help with bathing/dressing/bathroom;Assistance with cooking/housework;Assist for transportation;Help with stairs or ramp for entrance   Equipment Recommendations  None recommended by PT    Recommendations for Other Services       Precautions / Restrictions Precautions Precautions: Knee;Fall Precaution Comments: instructed no pillow under knee Restrictions Weight Bearing Restrictions: No Other Position/Activity Restrictions: WBAT        Balance                                            Cognition Arousal/Alertness: Awake/alert Behavior During Therapy: WFL for tasks assessed/performed Overall Cognitive Status: Within Functional Limits for tasks assessed                                 General Comments: AxO x 3 very pleasant Lady familiar from prior [facility]        Exercises  Total Knee Replacement TE's following HEP  handout 10 reps B LE ankle pumps 05 reps towel squeezes 05 reps knee presses 05 reps heel slides  05 reps SAQ's 05 reps SLR's 05 reps ABD 05 reps all seated TE's Educated on use of gait belt to assist with TE's Followed by ICE     General Comments        Pertinent Vitals/Pain Pain Assessment Pain Assessment: 0-10 Pain Score: 7  Pain Location: L knee, L thigh and L low back Pain Descriptors / Indicators: Aching, Guarding, Operative site guarding Pain Intervention(s): Monitored during session, Premedicated before session, Repositioned, Ice applied, Heat applied    Home Living                          Prior Function            PT Goals (current goals can now be found in the care plan section) Progress towards PT goals: Progressing toward goals    Frequency    7X/week      PT Plan Current plan remains appropriate    Co-evaluation              AM-PAC PT "6 Clicks" Mobility   Outcome Measure  Help needed turning from your back to your side while in a flat bed without using bedrails?: A Little Help needed moving from lying on your back to sitting  on the side of a flat bed without using bedrails?: A Little Help needed moving to and from a bed to a chair (including a wheelchair)?: A Little Help needed standing up from a chair using your arms (e.g., wheelchair or bedside chair)?: A Little Help needed to walk in hospital room?: A Little Help needed climbing 3-5 steps with a railing? : A Little 6 Click Score: 18    End of Session Equipment Utilized During Treatment: Gait belt Activity Tolerance: Patient tolerated treatment well Patient left: in chair;with call bell/phone within reach;with family/visitor present Nurse Communication: Mobility status;Patient requests pain meds PT Visit Diagnosis: Unsteadiness on feet (R26.81);Other abnormalities of gait and mobility (R26.89);Repeated falls (R29.6);Muscle weakness (generalized) (M62.81);Pain Pain -  Right/Left: Left Pain - part of body: Knee     Time: 1010-1035 PT Time Calculation (min) (ACUTE ONLY): 25 min  Charges:  $Therapeutic Exercise: 8-22 mins $Self Care/Home Management: [place]  PTA Acute  Rehabilitation Services Office M-F          [phone] Weekend pager [phone]

## 2022-07-03 NOTE — Anesthesia Postprocedure Evaluation (Signed)
Anesthesia Post Note  Patient: [name]  Procedure(s) Performed: TOTAL KNEE ARTHROPLASTY (Left: Knee)     Patient location during evaluation: PACU Anesthesia Type: Regional, MAC and Spinal Level of consciousness: awake and alert Pain management: pain level controlled Vital Signs Assessment: post-procedure vital signs reviewed and stable Respiratory status: spontaneous breathing, nonlabored ventilation, respiratory function stable and patient connected to nasal cannula oxygen Cardiovascular status: blood pressure returned to baseline and stable Postop Assessment: no apparent nausea or vomiting Anesthetic complications: no  No notable events documented.  Last Vitals:  Vitals:   07/03/22 0553 07/03/22 0916  BP: (!) 152/73 138/74  Pulse: 76 91  Resp: 18 18  Temp: 36.6 C 37.1 C  SpO2: 97% 97%    Last Pain:  Vitals:   07/03/22 0937  TempSrc:   PainSc: 7    Pain Goal: Patients Stated Pain Goal: 3 (07/03/22 0525)                 Krystal Swanson

## 2022-07-03 NOTE — TOC Transition Note (Signed)
Transition of Care [facility]) - CM/SW Discharge Note   Patient Details  Name: Krystal Swanson MRN: [id number] Date of Birth: May 18, 1950  [facility] CM/SW Contact:  Lennart Pall, LCSW Phone Number: 07/03/2022, 9:42 AM   Clinical Narrative:     Met with patient who confirms she has RW at home, however, would like 3n1 for bedside use.  Aware this will be private pay and agreeable with no DME agency preference - order placed with [employer] for delivery to room.  OPPT already arranged with [facility].  No further TOC needs.  Final next level of care: OP Rehab Barriers to Discharge: No Barriers Identified   Patient Goals and CMS Choice      Discharge Placement                         Discharge Plan and Services Additional resources added to the After Visit Summary for                  DME Arranged: 3-N-1 DME Agency: [employer] Date DME Agency Contacted: 07/03/22 Time DME Agency Contacted: [phone] Representative spoke with at DME Agency: Marjie Skiff            Social Determinants of Health ([place]) Interventions SDOH Screenings   Food Insecurity: No Food Insecurity (07/02/2022)  Housing: Low Risk  (07/02/2022)  Transportation Needs: No Transportation Needs (07/02/2022)  Utilities: Not At Risk (07/02/2022)  Tobacco Use: Medium Risk (07/02/2022)     Readmission Risk Interventions     No data to display

## 2022-07-03 NOTE — Progress Notes (Signed)
Physical Therapy Treatment Patient Details Name: Krystal Swanson MRN: [id number] DOB: 1951-04-24 Today's Date: 07/03/2022   History of Present Illness 72 yo female presents to therapy s/p L TKA on 07/02/2022 due to failure of conservative measures. Pt has pmh including but not limited to: trochanteric bursitis of R hip, Addison's dz, hypothyroidism,  and R THA, AA (2020).    PT Comments    POD # 1 second am session Assisted with amb a functional distance in hallway with spouse.  NO stairs to enter home. Addressed all mobility questions, discussed appropriate activity, educated on use of ICE.  Pt ready for D/C to home.   Recommendations for follow up therapy are one component of a multi-disciplinary discharge planning process, led by the attending physician.  Recommendations may be updated based on patient status, additional functional criteria and insurance authorization.  Follow Up Recommendations  Outpatient PT     Assistance Recommended at Discharge Frequent or constant Supervision/Assistance  Patient can return home with the following A little help with walking and/or transfers;A little help with bathing/dressing/bathroom;Assistance with cooking/housework;Assist for transportation;Help with stairs or ramp for entrance   Equipment Recommendations  None recommended by PT    Recommendations for Other Services       Precautions / Restrictions Precautions Precautions: Knee;Fall Precaution Comments: instructed no pillow under knee Restrictions Weight Bearing Restrictions: No Other Position/Activity Restrictions: WBAT     Mobility  Bed Mobility               General bed mobility comments: OOB in recliner    Transfers Overall transfer level: Needs assistance Equipment used: Rolling walker (2 wheels) Transfers: Sit to/from Stand Sit to Stand: Supervision, Min guard           General transfer comment: cues for safety and proper UE and LE placement     Ambulation/Gait Ambulation/Gait assistance: Supervision, Min guard Gait Distance (Feet): 55 Feet Assistive device: Rolling walker (2 wheels) Gait Pattern/deviations: Step-to pattern, Trunk flexed, Decreased stance time - left Gait velocity: decreased     General Gait Details: 25% VC's on proper walker to self distance as well as safety with turns.   Stairs             Wheelchair Mobility    Modified Rankin (Stroke Patients Only)       Balance                                            Cognition Arousal/Alertness: Awake/alert Behavior During Therapy: WFL for tasks assessed/performed Overall Cognitive Status: Within Functional Limits for tasks assessed                                 General Comments: AxO x 3 very pleasant Lady familiar from prior [name]        Exercises      General Comments        Pertinent Vitals/Pain Pain Assessment Pain Assessment: 0-10 Pain Score: 7  Pain Location: L knee, L thigh and L low back Pain Descriptors / Indicators: Aching, Guarding, Operative site guarding Pain Intervention(s): Monitored during session, Premedicated before session, Repositioned, Ice applied, Heat applied    Home Living  Prior Function            PT Goals (current goals can now be found in the care plan section) Progress towards PT goals: Progressing toward goals    Frequency    7X/week      PT Plan Current plan remains appropriate    Co-evaluation              AM-PAC PT "6 Clicks" Mobility   Outcome Measure  Help needed turning from your back to your side while in a flat bed without using bedrails?: A Little Help needed moving from lying on your back to sitting on the side of a flat bed without using bedrails?: A Little Help needed moving to and from a bed to a chair (including a wheelchair)?: A Little Help needed standing up from a chair using your arms (e.g.,  wheelchair or bedside chair)?: A Little Help needed to walk in hospital room?: A Little Help needed climbing 3-5 steps with a railing? : A Little 6 Click Score: 18    End of Session Equipment Utilized During Treatment: Gait belt Activity Tolerance: Patient tolerated treatment well Patient left: in chair;with call bell/phone within reach;with family/visitor present Nurse Communication: Mobility status;Patient requests pain meds PT Visit Diagnosis: Unsteadiness on feet (R26.81);Other abnormalities of gait and mobility (R26.89);Repeated falls (R29.6);Muscle weakness (generalized) (M62.81);Pain Pain - Right/Left: Left Pain - part of body: Knee     Time: [id number] PT Time Calculation (min) (ACUTE ONLY): 12 min  Charges:  $Gait Training: 8-22 mins                      Rica Koyanagi  PTA [facility] Office M-F          [phone] Weekend pager [phone]

## 2022-07-03 NOTE — Progress Notes (Signed)
Subjective: 1 Day Post-Op Procedure(s) (LRB): TOTAL KNEE ARTHROPLASTY (Left) Patient seen in rounds by Dr. Wynelle Link. Patient is well, and has had no acute complaints or problems. Denies SOB or chest pain. Denies calf pain. Foley cath to be removed this AM. Patient reports pain as mild. Worked with physical therapy yesterday and ambulated 52'. We will continue physical therapy today.  Objective: Vital signs in last 24 hours: Temp:  [97.7 F (36.5 C)-98.7 F (37.1 C)] 97.8 F (36.6 C) (02/27 0553) Pulse Rate:  [63-88] 76 (02/27 0553) Resp:  [13-21] 18 (02/27 0553) BP: (116-157)/(64-107) 152/73 (02/27 0553) SpO2:  [93 %-100 %] 97 % (02/27 0553) Weight:  [56.2 kg] 56.2 kg (02/26 0948)  Intake/Output from previous day:  Intake/Output Summary (Last 24 hours) at 07/03/2022 0750 Last data filed at 07/03/2022 0557 Gross per 24 hour  Intake 2468.58 ml  Output 3245 ml  Net -776.42 ml     Intake/Output this shift: No intake/output data recorded.  Labs: Recent Labs    07/03/22 0310  HGB 13.7   Recent Labs    07/03/22 0310  WBC 12.7*  RBC 4.05  HCT 40.7  PLT 209   Recent Labs    07/03/22 0310  NA 139  K 3.3*  CL 106  CO2 28  BUN 17  CREATININE 0.84  GLUCOSE 151*  CALCIUM 8.2*   No results for input(s): "LABPT", "INR" in the last 72 hours.  Exam: General - Patient is Alert and Oriented Extremity - Neurologically intact Neurovascular intact Sensation intact distally Dorsiflexion/Plantar flexion intact Dressing - dressing C/D/I Motor Function - intact, moving foot and toes well on exam.  Past Medical History:  Diagnosis Date   Addison's disease ([place])    Anxiety    Arthritis    Bilateral cataracts    Depression    Diverticulosis 2019   Noted on colonoscopy   Dyspnea    with exertion   GERD (gastroesophageal reflux disease)    History of colon polyps    Hyperlipidemia    Hypothyroidism    Osteoporosis    Pneumonia    Pre-diabetes    Seasonal  allergies    Tear of left biceps muscle    Vitamin D deficiency     Assessment/Plan: 1 Day Post-Op Procedure(s) (LRB): TOTAL KNEE ARTHROPLASTY (Left) Principal Problem:   OA (osteoarthritis) of knee Active Problems:   Primary osteoarthritis of left knee  Estimated body mass index is 25.04 kg/m as calculated from the following:   Height as of this encounter: '4\' 11"'$  (1.499 m).   Weight as of this encounter: 56.2 kg. Advance diet Up with therapy D/C IV fluids  Patient's anticipated LOS is less than 2 midnights, meeting these requirements: - Younger than 69 - Lives within 1 hour of care - Has a competent adult at home to recover with post-op recover - NO history of  - Chronic pain requiring opiods  - Coronary Artery Disease  - Heart failure  - Heart attack  - Stroke  - DVT/VTE  - Cardiac arrhythmia  - Respiratory Failure/COPD  - Renal failure  - Anemia  - Advanced Liver disease  DVT Prophylaxis - Aspirin Weight bearing as tolerated.  Continue physical therapy. Expected discharge home today pending progress and if meeting patient goals. Scheduled for OPPT at [facility] in [place]. Follow-up in clinic in 2 weeks.  Reviewed endocrinologist recommendations for hydrocortisone dosing after surgery. Patient will not need additional scripts for that.  The PDMP database was reviewed  today prior to any opioid medications being prescribed to this patient.  R. Jaynie Bream, PA-C Orthopedic Surgery [phone] 07/03/2022, 7:50 AM

## 2022-07-06 DIAGNOSIS — M25462 Effusion, left knee: Secondary | ICD-10-CM | POA: Diagnosis not present

## 2022-07-06 DIAGNOSIS — R2689 Other abnormalities of gait and mobility: Secondary | ICD-10-CM | POA: Diagnosis not present

## 2022-07-06 DIAGNOSIS — M25562 Pain in left knee: Secondary | ICD-10-CM | POA: Diagnosis not present

## 2022-07-06 DIAGNOSIS — M25662 Stiffness of left knee, not elsewhere classified: Secondary | ICD-10-CM | POA: Diagnosis not present

## 2022-07-10 DIAGNOSIS — M25662 Stiffness of left knee, not elsewhere classified: Secondary | ICD-10-CM | POA: Diagnosis not present

## 2022-07-10 DIAGNOSIS — M25462 Effusion, left knee: Secondary | ICD-10-CM | POA: Diagnosis not present

## 2022-07-10 DIAGNOSIS — M25562 Pain in left knee: Secondary | ICD-10-CM | POA: Diagnosis not present

## 2022-07-10 DIAGNOSIS — R2689 Other abnormalities of gait and mobility: Secondary | ICD-10-CM | POA: Diagnosis not present

## 2022-07-13 DIAGNOSIS — R2689 Other abnormalities of gait and mobility: Secondary | ICD-10-CM | POA: Diagnosis not present

## 2022-07-13 DIAGNOSIS — M25462 Effusion, left knee: Secondary | ICD-10-CM | POA: Diagnosis not present

## 2022-07-13 DIAGNOSIS — M25562 Pain in left knee: Secondary | ICD-10-CM | POA: Diagnosis not present

## 2022-07-13 DIAGNOSIS — M25662 Stiffness of left knee, not elsewhere classified: Secondary | ICD-10-CM | POA: Diagnosis not present

## 2022-07-16 DIAGNOSIS — M25462 Effusion, left knee: Secondary | ICD-10-CM | POA: Diagnosis not present

## 2022-07-16 DIAGNOSIS — R2689 Other abnormalities of gait and mobility: Secondary | ICD-10-CM | POA: Diagnosis not present

## 2022-07-16 DIAGNOSIS — M25562 Pain in left knee: Secondary | ICD-10-CM | POA: Diagnosis not present

## 2022-07-16 DIAGNOSIS — M25662 Stiffness of left knee, not elsewhere classified: Secondary | ICD-10-CM | POA: Diagnosis not present

## 2022-07-20 DIAGNOSIS — M25462 Effusion, left knee: Secondary | ICD-10-CM | POA: Diagnosis not present

## 2022-07-20 DIAGNOSIS — M25662 Stiffness of left knee, not elsewhere classified: Secondary | ICD-10-CM | POA: Diagnosis not present

## 2022-07-20 DIAGNOSIS — M25562 Pain in left knee: Secondary | ICD-10-CM | POA: Diagnosis not present

## 2022-07-20 DIAGNOSIS — R2689 Other abnormalities of gait and mobility: Secondary | ICD-10-CM | POA: Diagnosis not present

## 2022-07-24 DIAGNOSIS — M25662 Stiffness of left knee, not elsewhere classified: Secondary | ICD-10-CM | POA: Diagnosis not present

## 2022-07-24 DIAGNOSIS — M25562 Pain in left knee: Secondary | ICD-10-CM | POA: Diagnosis not present

## 2022-07-24 DIAGNOSIS — M25462 Effusion, left knee: Secondary | ICD-10-CM | POA: Diagnosis not present

## 2022-07-24 DIAGNOSIS — R2689 Other abnormalities of gait and mobility: Secondary | ICD-10-CM | POA: Diagnosis not present

## 2022-07-27 DIAGNOSIS — R2689 Other abnormalities of gait and mobility: Secondary | ICD-10-CM | POA: Diagnosis not present

## 2022-07-27 DIAGNOSIS — M25462 Effusion, left knee: Secondary | ICD-10-CM | POA: Diagnosis not present

## 2022-07-27 DIAGNOSIS — M25562 Pain in left knee: Secondary | ICD-10-CM | POA: Diagnosis not present

## 2022-07-27 DIAGNOSIS — M25662 Stiffness of left knee, not elsewhere classified: Secondary | ICD-10-CM | POA: Diagnosis not present

## 2022-07-31 DIAGNOSIS — R2689 Other abnormalities of gait and mobility: Secondary | ICD-10-CM | POA: Diagnosis not present

## 2022-07-31 DIAGNOSIS — M25562 Pain in left knee: Secondary | ICD-10-CM | POA: Diagnosis not present

## 2022-07-31 DIAGNOSIS — M25662 Stiffness of left knee, not elsewhere classified: Secondary | ICD-10-CM | POA: Diagnosis not present

## 2022-07-31 DIAGNOSIS — M25462 Effusion, left knee: Secondary | ICD-10-CM | POA: Diagnosis not present

## 2022-08-02 DIAGNOSIS — M25662 Stiffness of left knee, not elsewhere classified: Secondary | ICD-10-CM | POA: Diagnosis not present

## 2022-08-02 DIAGNOSIS — M25462 Effusion, left knee: Secondary | ICD-10-CM | POA: Diagnosis not present

## 2022-08-02 DIAGNOSIS — M25562 Pain in left knee: Secondary | ICD-10-CM | POA: Diagnosis not present

## 2022-08-02 DIAGNOSIS — R2689 Other abnormalities of gait and mobility: Secondary | ICD-10-CM | POA: Diagnosis not present

## 2022-08-07 DIAGNOSIS — Z471 Aftercare following joint replacement surgery: Secondary | ICD-10-CM | POA: Diagnosis not present

## 2022-08-15 DIAGNOSIS — R2689 Other abnormalities of gait and mobility: Secondary | ICD-10-CM | POA: Diagnosis not present

## 2022-08-15 DIAGNOSIS — M25662 Stiffness of left knee, not elsewhere classified: Secondary | ICD-10-CM | POA: Diagnosis not present

## 2022-08-15 DIAGNOSIS — M25562 Pain in left knee: Secondary | ICD-10-CM | POA: Diagnosis not present

## 2022-08-15 DIAGNOSIS — M25462 Effusion, left knee: Secondary | ICD-10-CM | POA: Diagnosis not present

## 2022-08-16 DIAGNOSIS — E271 Primary adrenocortical insufficiency: Secondary | ICD-10-CM | POA: Diagnosis not present

## 2022-08-16 DIAGNOSIS — E039 Hypothyroidism, unspecified: Secondary | ICD-10-CM | POA: Diagnosis not present

## 2022-08-17 DIAGNOSIS — R2689 Other abnormalities of gait and mobility: Secondary | ICD-10-CM | POA: Diagnosis not present

## 2022-08-17 DIAGNOSIS — M25662 Stiffness of left knee, not elsewhere classified: Secondary | ICD-10-CM | POA: Diagnosis not present

## 2022-08-17 DIAGNOSIS — M25562 Pain in left knee: Secondary | ICD-10-CM | POA: Diagnosis not present

## 2022-08-17 DIAGNOSIS — M25462 Effusion, left knee: Secondary | ICD-10-CM | POA: Diagnosis not present

## 2022-08-20 DIAGNOSIS — R2689 Other abnormalities of gait and mobility: Secondary | ICD-10-CM | POA: Diagnosis not present

## 2022-08-20 DIAGNOSIS — M25562 Pain in left knee: Secondary | ICD-10-CM | POA: Diagnosis not present

## 2022-08-20 DIAGNOSIS — M25462 Effusion, left knee: Secondary | ICD-10-CM | POA: Diagnosis not present

## 2022-08-20 DIAGNOSIS — M25662 Stiffness of left knee, not elsewhere classified: Secondary | ICD-10-CM | POA: Diagnosis not present

## 2022-08-22 DIAGNOSIS — D225 Melanocytic nevi of trunk: Secondary | ICD-10-CM | POA: Diagnosis not present

## 2022-08-22 DIAGNOSIS — D2239 Melanocytic nevi of other parts of face: Secondary | ICD-10-CM | POA: Diagnosis not present

## 2022-08-22 DIAGNOSIS — L821 Other seborrheic keratosis: Secondary | ICD-10-CM | POA: Diagnosis not present

## 2022-08-22 DIAGNOSIS — L578 Other skin changes due to chronic exposure to nonionizing radiation: Secondary | ICD-10-CM | POA: Diagnosis not present

## 2022-08-22 DIAGNOSIS — B079 Viral wart, unspecified: Secondary | ICD-10-CM | POA: Diagnosis not present

## 2022-08-23 DIAGNOSIS — M25462 Effusion, left knee: Secondary | ICD-10-CM | POA: Diagnosis not present

## 2022-08-23 DIAGNOSIS — R2689 Other abnormalities of gait and mobility: Secondary | ICD-10-CM | POA: Diagnosis not present

## 2022-08-23 DIAGNOSIS — M25662 Stiffness of left knee, not elsewhere classified: Secondary | ICD-10-CM | POA: Diagnosis not present

## 2022-08-23 DIAGNOSIS — M25562 Pain in left knee: Secondary | ICD-10-CM | POA: Diagnosis not present

## 2022-08-27 DIAGNOSIS — M545 Low back pain, unspecified: Secondary | ICD-10-CM | POA: Diagnosis not present

## 2022-08-27 DIAGNOSIS — M4316 Spondylolisthesis, lumbar region: Secondary | ICD-10-CM | POA: Diagnosis not present

## 2022-08-27 DIAGNOSIS — M25462 Effusion, left knee: Secondary | ICD-10-CM | POA: Diagnosis not present

## 2022-08-27 DIAGNOSIS — R2689 Other abnormalities of gait and mobility: Secondary | ICD-10-CM | POA: Diagnosis not present

## 2022-08-27 DIAGNOSIS — M25662 Stiffness of left knee, not elsewhere classified: Secondary | ICD-10-CM | POA: Diagnosis not present

## 2022-08-27 DIAGNOSIS — M5416 Radiculopathy, lumbar region: Secondary | ICD-10-CM | POA: Diagnosis not present

## 2022-08-27 DIAGNOSIS — M25562 Pain in left knee: Secondary | ICD-10-CM | POA: Diagnosis not present

## 2022-08-30 DIAGNOSIS — M4316 Spondylolisthesis, lumbar region: Secondary | ICD-10-CM | POA: Diagnosis not present

## 2022-08-30 DIAGNOSIS — M5416 Radiculopathy, lumbar region: Secondary | ICD-10-CM | POA: Diagnosis not present

## 2022-08-31 DIAGNOSIS — R2689 Other abnormalities of gait and mobility: Secondary | ICD-10-CM | POA: Diagnosis not present

## 2022-08-31 DIAGNOSIS — M25562 Pain in left knee: Secondary | ICD-10-CM | POA: Diagnosis not present

## 2022-08-31 DIAGNOSIS — M25462 Effusion, left knee: Secondary | ICD-10-CM | POA: Diagnosis not present

## 2022-08-31 DIAGNOSIS — M25662 Stiffness of left knee, not elsewhere classified: Secondary | ICD-10-CM | POA: Diagnosis not present

## 2022-09-03 DIAGNOSIS — M25662 Stiffness of left knee, not elsewhere classified: Secondary | ICD-10-CM | POA: Diagnosis not present

## 2022-09-03 DIAGNOSIS — R2689 Other abnormalities of gait and mobility: Secondary | ICD-10-CM | POA: Diagnosis not present

## 2022-09-03 DIAGNOSIS — M25562 Pain in left knee: Secondary | ICD-10-CM | POA: Diagnosis not present

## 2022-09-03 DIAGNOSIS — M25462 Effusion, left knee: Secondary | ICD-10-CM | POA: Diagnosis not present

## 2022-09-06 DIAGNOSIS — R2689 Other abnormalities of gait and mobility: Secondary | ICD-10-CM | POA: Diagnosis not present

## 2022-09-06 DIAGNOSIS — M25562 Pain in left knee: Secondary | ICD-10-CM | POA: Diagnosis not present

## 2022-09-06 DIAGNOSIS — M25462 Effusion, left knee: Secondary | ICD-10-CM | POA: Diagnosis not present

## 2022-09-06 DIAGNOSIS — M25662 Stiffness of left knee, not elsewhere classified: Secondary | ICD-10-CM | POA: Diagnosis not present

## 2022-09-27 DIAGNOSIS — E039 Hypothyroidism, unspecified: Secondary | ICD-10-CM | POA: Diagnosis not present

## 2022-11-20 DIAGNOSIS — E271 Primary adrenocortical insufficiency: Secondary | ICD-10-CM | POA: Diagnosis not present

## 2022-11-20 DIAGNOSIS — E039 Hypothyroidism, unspecified: Secondary | ICD-10-CM | POA: Diagnosis not present

## 2022-12-03 DIAGNOSIS — S0101XA Laceration without foreign body of scalp, initial encounter: Secondary | ICD-10-CM | POA: Diagnosis not present

## 2022-12-20 DIAGNOSIS — Z471 Aftercare following joint replacement surgery: Secondary | ICD-10-CM | POA: Diagnosis not present

## 2022-12-20 DIAGNOSIS — Z96652 Presence of left artificial knee joint: Secondary | ICD-10-CM | POA: Diagnosis not present

## 2023-01-28 DIAGNOSIS — E271 Primary adrenocortical insufficiency: Secondary | ICD-10-CM | POA: Diagnosis not present

## 2023-01-28 DIAGNOSIS — E039 Hypothyroidism, unspecified: Secondary | ICD-10-CM | POA: Diagnosis not present

## 2023-01-28 DIAGNOSIS — E782 Mixed hyperlipidemia: Secondary | ICD-10-CM | POA: Diagnosis not present

## 2023-01-28 DIAGNOSIS — N390 Urinary tract infection, site not specified: Secondary | ICD-10-CM | POA: Diagnosis not present

## 2023-01-28 DIAGNOSIS — R053 Chronic cough: Secondary | ICD-10-CM | POA: Diagnosis not present

## 2023-04-01 ENCOUNTER — Encounter: Payer: Self-pay | Admitting: Gastroenterology

## 2023-05-06 DIAGNOSIS — H25813 Combined forms of age-related cataract, bilateral: Secondary | ICD-10-CM | POA: Diagnosis not present

## 2023-05-13 DIAGNOSIS — H40033 Anatomical narrow angle, bilateral: Secondary | ICD-10-CM | POA: Diagnosis not present

## 2023-05-13 DIAGNOSIS — H25813 Combined forms of age-related cataract, bilateral: Secondary | ICD-10-CM | POA: Diagnosis not present

## 2023-05-15 DIAGNOSIS — Z1212 Encounter for screening for malignant neoplasm of rectum: Secondary | ICD-10-CM | POA: Diagnosis not present

## 2023-05-15 DIAGNOSIS — Z1211 Encounter for screening for malignant neoplasm of colon: Secondary | ICD-10-CM | POA: Diagnosis not present

## 2023-05-20 DIAGNOSIS — H2511 Age-related nuclear cataract, right eye: Secondary | ICD-10-CM | POA: Diagnosis not present

## 2023-05-20 DIAGNOSIS — H269 Unspecified cataract: Secondary | ICD-10-CM | POA: Diagnosis not present

## 2023-05-20 DIAGNOSIS — H25811 Combined forms of age-related cataract, right eye: Secondary | ICD-10-CM | POA: Diagnosis not present

## 2023-05-20 DIAGNOSIS — H5703 Miosis: Secondary | ICD-10-CM | POA: Diagnosis not present

## 2023-05-20 DIAGNOSIS — H5789 Other specified disorders of eye and adnexa: Secondary | ICD-10-CM | POA: Diagnosis not present

## 2023-05-20 DIAGNOSIS — H52201 Unspecified astigmatism, right eye: Secondary | ICD-10-CM | POA: Diagnosis not present

## 2023-06-17 DIAGNOSIS — R2231 Localized swelling, mass and lump, right upper limb: Secondary | ICD-10-CM | POA: Diagnosis not present

## 2023-06-20 DIAGNOSIS — R0981 Nasal congestion: Secondary | ICD-10-CM | POA: Diagnosis not present

## 2023-06-20 DIAGNOSIS — R051 Acute cough: Secondary | ICD-10-CM | POA: Diagnosis not present

## 2023-06-21 DIAGNOSIS — R2231 Localized swelling, mass and lump, right upper limb: Secondary | ICD-10-CM | POA: Diagnosis not present

## 2023-06-21 DIAGNOSIS — M25531 Pain in right wrist: Secondary | ICD-10-CM | POA: Diagnosis not present

## 2023-06-24 DIAGNOSIS — R2231 Localized swelling, mass and lump, right upper limb: Secondary | ICD-10-CM | POA: Diagnosis not present

## 2023-06-28 DIAGNOSIS — J329 Chronic sinusitis, unspecified: Secondary | ICD-10-CM | POA: Diagnosis not present

## 2023-06-28 DIAGNOSIS — J40 Bronchitis, not specified as acute or chronic: Secondary | ICD-10-CM | POA: Diagnosis not present

## 2023-07-03 DIAGNOSIS — E039 Hypothyroidism, unspecified: Secondary | ICD-10-CM | POA: Diagnosis not present

## 2023-07-03 DIAGNOSIS — Z0000 Encounter for general adult medical examination without abnormal findings: Secondary | ICD-10-CM | POA: Diagnosis not present

## 2023-07-03 DIAGNOSIS — Z9181 History of falling: Secondary | ICD-10-CM | POA: Diagnosis not present

## 2023-07-03 DIAGNOSIS — E271 Primary adrenocortical insufficiency: Secondary | ICD-10-CM | POA: Diagnosis not present

## 2023-07-03 DIAGNOSIS — R7309 Other abnormal glucose: Secondary | ICD-10-CM | POA: Diagnosis not present

## 2023-07-03 DIAGNOSIS — Z79899 Other long term (current) drug therapy: Secondary | ICD-10-CM | POA: Diagnosis not present

## 2023-07-05 DIAGNOSIS — Z96652 Presence of left artificial knee joint: Secondary | ICD-10-CM | POA: Diagnosis not present

## 2023-08-05 DIAGNOSIS — R0982 Postnasal drip: Secondary | ICD-10-CM | POA: Diagnosis not present

## 2023-08-05 DIAGNOSIS — R0981 Nasal congestion: Secondary | ICD-10-CM | POA: Diagnosis not present

## 2023-08-05 DIAGNOSIS — R519 Headache, unspecified: Secondary | ICD-10-CM | POA: Diagnosis not present

## 2023-08-05 DIAGNOSIS — R0600 Dyspnea, unspecified: Secondary | ICD-10-CM | POA: Diagnosis not present

## 2023-08-05 DIAGNOSIS — R062 Wheezing: Secondary | ICD-10-CM | POA: Diagnosis not present

## 2023-08-05 DIAGNOSIS — R051 Acute cough: Secondary | ICD-10-CM | POA: Diagnosis not present

## 2023-08-05 DIAGNOSIS — R070 Pain in throat: Secondary | ICD-10-CM | POA: Diagnosis not present

## 2023-08-14 DIAGNOSIS — K529 Noninfective gastroenteritis and colitis, unspecified: Secondary | ICD-10-CM | POA: Diagnosis not present

## 2023-08-14 DIAGNOSIS — E271 Primary adrenocortical insufficiency: Secondary | ICD-10-CM | POA: Diagnosis not present

## 2023-08-21 DIAGNOSIS — D225 Melanocytic nevi of trunk: Secondary | ICD-10-CM | POA: Diagnosis not present

## 2023-08-21 DIAGNOSIS — L720 Epidermal cyst: Secondary | ICD-10-CM | POA: Diagnosis not present

## 2023-08-21 DIAGNOSIS — D2239 Melanocytic nevi of other parts of face: Secondary | ICD-10-CM | POA: Diagnosis not present

## 2023-08-21 DIAGNOSIS — D1721 Benign lipomatous neoplasm of skin and subcutaneous tissue of right arm: Secondary | ICD-10-CM | POA: Diagnosis not present

## 2023-08-26 DIAGNOSIS — Z1231 Encounter for screening mammogram for malignant neoplasm of breast: Secondary | ICD-10-CM | POA: Diagnosis not present

## 2023-08-28 DIAGNOSIS — Z13820 Encounter for screening for osteoporosis: Secondary | ICD-10-CM | POA: Diagnosis not present

## 2023-08-28 DIAGNOSIS — Z780 Asymptomatic menopausal state: Secondary | ICD-10-CM | POA: Diagnosis not present

## 2023-08-28 DIAGNOSIS — M810 Age-related osteoporosis without current pathological fracture: Secondary | ICD-10-CM | POA: Diagnosis not present

## 2023-09-10 DIAGNOSIS — M810 Age-related osteoporosis without current pathological fracture: Secondary | ICD-10-CM | POA: Diagnosis not present

## 2023-09-10 DIAGNOSIS — E039 Hypothyroidism, unspecified: Secondary | ICD-10-CM | POA: Diagnosis not present

## 2023-09-10 DIAGNOSIS — E271 Primary adrenocortical insufficiency: Secondary | ICD-10-CM | POA: Diagnosis not present

## 2023-10-25 DIAGNOSIS — E039 Hypothyroidism, unspecified: Secondary | ICD-10-CM | POA: Diagnosis not present

## 2023-11-04 DIAGNOSIS — M25521 Pain in right elbow: Secondary | ICD-10-CM | POA: Diagnosis not present

## 2023-11-04 DIAGNOSIS — M25511 Pain in right shoulder: Secondary | ICD-10-CM | POA: Diagnosis not present

## 2023-11-04 DIAGNOSIS — W19XXXA Unspecified fall, initial encounter: Secondary | ICD-10-CM | POA: Diagnosis not present

## 2023-11-04 DIAGNOSIS — S6991XA Unspecified injury of right wrist, hand and finger(s), initial encounter: Secondary | ICD-10-CM | POA: Diagnosis not present

## 2023-11-04 DIAGNOSIS — M2550 Pain in unspecified joint: Secondary | ICD-10-CM | POA: Diagnosis not present

## 2023-11-04 DIAGNOSIS — M25531 Pain in right wrist: Secondary | ICD-10-CM | POA: Diagnosis not present

## 2023-11-20 DIAGNOSIS — M25531 Pain in right wrist: Secondary | ICD-10-CM | POA: Diagnosis not present

## 2023-11-20 DIAGNOSIS — S52501A Unspecified fracture of the lower end of right radius, initial encounter for closed fracture: Secondary | ICD-10-CM | POA: Diagnosis not present

## 2023-11-25 DIAGNOSIS — H269 Unspecified cataract: Secondary | ICD-10-CM | POA: Diagnosis not present

## 2023-11-25 DIAGNOSIS — H25812 Combined forms of age-related cataract, left eye: Secondary | ICD-10-CM | POA: Diagnosis not present

## 2023-11-25 DIAGNOSIS — H2512 Age-related nuclear cataract, left eye: Secondary | ICD-10-CM | POA: Diagnosis not present

## 2023-11-25 DIAGNOSIS — H52202 Unspecified astigmatism, left eye: Secondary | ICD-10-CM | POA: Diagnosis not present

## 2023-12-04 DIAGNOSIS — S52501A Unspecified fracture of the lower end of right radius, initial encounter for closed fracture: Secondary | ICD-10-CM | POA: Diagnosis not present

## 2023-12-19 DIAGNOSIS — S52501A Unspecified fracture of the lower end of right radius, initial encounter for closed fracture: Secondary | ICD-10-CM | POA: Diagnosis not present

## 2024-01-03 DIAGNOSIS — H0288B Meibomian gland dysfunction left eye, upper and lower eyelids: Secondary | ICD-10-CM | POA: Diagnosis not present

## 2024-02-20 DIAGNOSIS — M4150 Other secondary scoliosis, site unspecified: Secondary | ICD-10-CM | POA: Diagnosis not present

## 2024-02-20 DIAGNOSIS — M5137 Other intervertebral disc degeneration, lumbosacral region with discogenic back pain only: Secondary | ICD-10-CM | POA: Diagnosis not present

## 2024-02-20 DIAGNOSIS — M25551 Pain in right hip: Secondary | ICD-10-CM | POA: Diagnosis not present

## 2024-02-26 DIAGNOSIS — M79604 Pain in right leg: Secondary | ICD-10-CM | POA: Diagnosis not present

## 2024-02-26 DIAGNOSIS — M545 Low back pain, unspecified: Secondary | ICD-10-CM | POA: Diagnosis not present

## 2024-02-26 DIAGNOSIS — E039 Hypothyroidism, unspecified: Secondary | ICD-10-CM | POA: Diagnosis not present

## 2024-02-26 DIAGNOSIS — K5900 Constipation, unspecified: Secondary | ICD-10-CM | POA: Diagnosis not present

## 2024-03-19 NOTE — Progress Notes (Unsigned)
 03/20/2024 Krystal Swanson [id number] [birth year]  Referring provider: Fernand Tracey LABOR, MD Primary GI doctor: Dr. Charlanne  ASSESSMENT AND PLAN:   GERD Normal LFTs, CBC 10/25 2019 EGD mild gastritis otherwise normal - on pepcid  as needed Likely worsening due to opoids/constipation, some dysphagia/oropharangeal dysphagia, declines evaluation - optimize bowel habits first -get on pepcid  daily -Lifestyle changes discussed, avoid NSAIDS, ETOH, hand out given to the patient - consider RUQ US  and HIDA pending results - consider EGD/barium swallow if symptoms are not better at follow up 1 month  Constipation Worsening since fall in July with opoid use, stopped opoids last week, now on lyrica, on vesicare for urinary incontinence Had Xray with PCP that showed constipation Can be 5 days before BM, with bloating, upper GERD and nausea.  - Increase fiber/ water  intake, decrease caffeine, increase activity level. - do bowel prep 2 day, enemas, add miralax  2 tablespoons, consider linzess 145 mcg if miralax  not helping  Diverticulosis Will call if any symptoms. Add on fiber supplement, avoid NSAIDS, information given  History of AVM proximal ascending colon No hematochezia   History of colonic polyps 09/2017 Negative cologuard March 2025  Addison's disease On Cortef  x 1989  Patient Care Team: Ina Marcellus RAMAN, MD as PCP - General (Family Medicine)  HISTORY OF PRESENT ILLNESS: 73 y.o. female with a past medical history listed below presents for evaluation of constipation.   Last seen My Dr. Charlanne 2019 for GERD.   Discussed the use of AI scribe software for clinical note transcription with the patient, who gave verbal consent to proceed.  History of Present Illness   Krystal Swanson is a 73 year old female who presents with worsening constipation and gastrointestinal symptoms following a fall. She is accompanied by her partner.  In July, she experienced a fall at home,  resulting in fractures to her wrist, shoulder, and back. Following the fall, she had an x-ray of her back, and she recalls being told there were deposits in the intestinal tract. Since then, she has experienced worsening constipation, with bowel movements occurring as infrequently as every five days, accompanied by nausea, indigestion, and upper gastrointestinal discomfort.  She reports alternating between constipation and mucousy diarrhea. To manage her symptoms, she has been taking laxatives such as Miralax , Dulcolax, and magnesium citrate. Despite these efforts, she continues to experience significant constipation and occasional diarrhea. After taking laxatives, she sometimes experiences diarrhea, but the constipation returns shortly after.  She has a history of hip and knee replacement and initially suspected her hip might be involved due to her symptoms, but imaging ruled this out. She has been on VESIcare for bladder issues for a couple of years, which causes some urinary urgency and occasional gushes. 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  She recently stopped taking oxycodone  and hydrocodone  due to side effects and is currently on Lyrica for pain management. The opioids made her feel 'too loopy.'  In terms of gastrointestinal symptoms, she experiences bloating and discomfort primarily on the right side, which she finds difficult to distinguish from her back pain. No dark or bloody stools, but her diarrhea is light in color. She also reports occasional difficulty swallowing, needing to eat slowly, and sometimes experiencing pills or food getting stuck if she eats too quickly.  Her past medical history includes a negative Cologuard test in March, despite experiencing severe diarrhea at the time. She has a history of mild gastritis from a 2019  endoscopy, but no significant findings were noted at that time.        She  reports that she has quit smoking. Her  smoking use included cigarettes. She has a 15 pack-year smoking history. She has never used smokeless tobacco. She reports current alcohol use. She reports that she does not use drugs.  RELEVANT GI HISTORY, IMAGING AND LABS: Results   LABS Liver function: Within normal limits (02/26/2024) CBC: Elevated hemoglobin, normal platelets (02/26/2024)  RADIOLOGY Back X-ray: Intestinal tract calcifications (11/2023) Lumbar spine MRI: Findings consistent with lumbar pain  DIAGNOSTIC Cologuard: Negative (07/2023) Endoscopy: Mild gastritis (2019)      CBC    Component Value Date/Time   WBC 12.7 (H) 07/03/2022 0310   RBC 4.05 07/03/2022 0310   HGB 13.7 07/03/2022 0310   HCT 40.7 07/03/2022 0310   PLT 209 07/03/2022 0310   MCV 100.5 (H) 07/03/2022 0310   MCH 33.8 07/03/2022 0310   MCHC 33.7 07/03/2022 0310   RDW 12.7 07/03/2022 0310   No results for input(s): HGB in the last 8760 hours.  CMP     Component Value Date/Time   NA 139 07/03/2022 0310   K 3.3 (L) 07/03/2022 0310   CL 106 07/03/2022 0310   CO2 28 07/03/2022 0310   GLUCOSE 151 (H) 07/03/2022 0310   BUN 17 07/03/2022 0310   CREATININE 0.84 07/03/2022 0310   CALCIUM  8.2 (L) 07/03/2022 0310   PROT 6.7 09/16/2018 1540   ALBUMIN 4.2 09/16/2018 1540   AST 19 09/16/2018 1540   ALT 34 09/16/2018 1540   ALKPHOS 45 09/16/2018 1540   BILITOT 0.7 09/16/2018 1540   GFRNONAA >60 07/03/2022 0310   GFRAA >60 09/23/2018 1550      Latest Ref Rng & Units 09/16/2018    3:40 PM  Hepatic Function  Total Protein 6.5 - 8.1 g/dL 6.7   Albumin 3.5 - 5.0 g/dL 4.2   AST 15 - 41 U/L 19   ALT 0 - 44 U/L 34   Alk Phosphatase 38 - 126 U/L 45   Total Bilirubin 0.3 - 1.2 mg/dL 0.7       Current Medications:   Current Outpatient Medications (Endocrine & Metabolic):    hydrocortisone  (CORTEF ) 10 MG tablet, Take 15-20 mg by mouth See admin instructions. Take 20 mg by mouth in the morning and 15 mg in the afternoon   levothyroxine   (SYNTHROID , LEVOTHROID) 88 MCG tablet, Take 88 mcg by mouth daily before breakfast.   Current Outpatient Medications (Cardiovascular):    rosuvastatin  (CRESTOR ) 10 MG tablet, Take 10 mg by mouth every evening.  Current Outpatient Medications (Respiratory):    loratadine  (CLARITIN ) 10 MG tablet, Take 10 mg by mouth daily.  Current Outpatient Medications (Analgesics):    oxyCODONE  (OXY IR/ROXICODONE ) 5 MG immediate release tablet, Take 1-2 tablets (5-10 mg total) by mouth every 6 (six) hours as needed for severe pain.  Current Outpatient Medications (Hematological):    cyanocobalamin (VITAMIN B12) 1000 MCG tablet, Take 1,000 mcg by mouth daily.  Current Outpatient Medications (Other):    Ascorbic Acid (VITAMIN C) 1000 MG tablet, Take 1,000 mg by mouth daily.   CALCIUM  MAGNESIUM ZINC PO, Take 1 tablet by mouth daily.   Cholecalciferol (VITAMIN D) 125 MCG (5000 UT) CAPS, Take 5,000 Units by mouth daily.   famotidine  (PEPCID ) 20 MG tablet, Take 20 mg by mouth daily as needed for heartburn or indigestion.   Omega-3 Fatty Acids (FISH OIL) 1000 MG CAPS, Take 1,000 mg by  mouth 3 (three) times a week.   OVER THE COUNTER MEDICATION, Take 1 capsule by mouth every evening. Menopause Transitions   OVER THE COUNTER MEDICATION, Vitamin d 3 5000 units daily   solifenacin (VESICARE) 5 MG tablet, Take 5 mg by mouth daily as needed (overactive bladder).   venlafaxine  XR (EFFEXOR -XR) 75 MG 24 hr capsule, Take 75 mg by mouth daily.   vitamin E 180 MG (400 UNITS) capsule, Take 400 Units by mouth daily.   polyethylene glycol-electrolytes (NULYTELY ) 420 g solution, do 6 oz every 30 mins until the impaction passes, can do 1/2 gallon on first day and 1/2 gallon on 2nd day.  Can continue 2 fleets enemas a day until it resolves.  Medical History:  Past Medical History:  Diagnosis Date   Addison's disease ([facility])    Anxiety    Arthritis    Bilateral cataracts    Depression    Diverticulosis 2019   Noted on  colonoscopy   Dyspnea    with exertion   GERD (gastroesophageal reflux disease)    History of colon polyps    Hyperlipidemia    Hypothyroidism    Osteoporosis    Pneumonia    Pre-diabetes    Seasonal allergies    Tear of left biceps muscle    Vitamin D deficiency    Allergies: No Known Allergies   Surgical History:  She  has a past surgical history that includes Colonoscopy (2014); colon polyps (2009); Upper gi endoscopy; Total hip arthroplasty (Right, 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 Family History:  Her family history includes Breast cancer in her maternal aunt; Colon cancer in her maternal aunt; Heart attack in her father; Peptic Ulcer in her father and mother; Stroke in her father.  REVIEW OF SYSTEMS  : All other systems reviewed and negative except where noted in the History of Present Illness.  PHYSICAL EXAM: BP 130/70   Pulse 82   Ht 4' 11 (1.499 m)   Wt 127 lb (57.6 kg)   SpO2 97%   BMI 25.65 kg/m  Physical Exam   GENERAL APPEARANCE: Well nourished, in no apparent distress HEENT: No cervical lymphadenopathy, unremarkable thyroid , sclerae anicteric, conjunctiva pink RESPIRATORY: Respiratory effort normal, BS equal bilateral without rales, rhonchi, wheezing CARDIO: RRR with no MRGs, peripheral pulses intact ABDOMEN: Soft, non distended, active bowel sounds in all 4 quadrants, no tenderness to palpation, no rebound, no mass appreciated RECTAL: declines MUSCULOSKELETAL: Full ROM, normal gait, without edema SKIN: Dry, intact without rashes or lesions. No jaundice. NEURO: Alert, oriented, no focal deficits PSYCH: Cooperative, normal mood and affect.      Alan JONELLE Coombs, PA-C 11:07 AM

## 2024-03-20 ENCOUNTER — Encounter: Payer: Self-pay | Admitting: Physician Assistant

## 2024-03-20 ENCOUNTER — Ambulatory Visit: Admitting: Physician Assistant

## 2024-03-20 VITALS — BP 130/70 | HR 82 | Ht 59.0 in | Wt 127.0 lb

## 2024-03-20 DIAGNOSIS — K219 Gastro-esophageal reflux disease without esophagitis: Secondary | ICD-10-CM | POA: Diagnosis not present

## 2024-03-20 DIAGNOSIS — K5730 Diverticulosis of large intestine without perforation or abscess without bleeding: Secondary | ICD-10-CM

## 2024-03-20 MED ORDER — PEG 3350-KCL-NA BICARB-NACL 420 G PO SOLR: ORAL | 0 refills | Status: DC

## 2024-03-20 MED ORDER — PEG 3350-KCL-NA BICARB-NACL 420 G PO SOLR: ORAL | 0 refills | Status: AC

## 2024-03-20 NOTE — Patient Instructions (Addendum)
 Please send in Trilyte - take as directed- but do 6 oz every 30 mins until the impaction passes, can do 1/2 gallon on first day and 1/2 gallon on 2nd day.  Can continue 2 fleets enemas a day until it resolves.  Go to ER if any severe AB pain, vomiting/unable to hold down food/drink, blood in stool, or unable to pass gas.  After the bowel purge, start on miralax  twice a day, if this does not help after a week, get on linzess samples.   Miralax  is an osmotic laxative.  It only brings more water  into the stool.  This is safe to take daily.  Can take up to 17 gram of miralax  twice a day.  Mix with juice or coffee.  Start 1 capful twice a day 3-4 days and reassess your response in 3-4 days.  You can increase and decrease the dose based on your response.  Remember, it can take up to 3-4 days to take effect OR for the effects to wear off.   I often pair this with benefiber in the morning to help assure the stool is not too loose.    We have given you samples of the following medication to take: Linzess 145 mcg *IBS-C patients may begin to experience relief from belly pain and overall abdominal symptoms (pain, discomfort, and bloating) in about 1 week,  with symptoms typically improving over 12 weeks.  Take at least 30 minutes before the first meal of the day on an empty stomach You can have a loose stool if you eat a high-fat breakfast. Give it at least 7 days, may have more bowel movements during that time.   The diarrhea should go away and you should start having normal, complete, full bowel movements.  It may be helpful to start treatment when you can be near the comfort of your own bathroom, such as a weekend.  After you are out we can send in a prescription if you did well, there is a prescription card  Toileting tips to help with your constipation - Drink at least 64-80 ounces of water /liquid per day. - Establish a time to try to move your bowels every day.  For many people, this is  after a cup of coffee or after a meal such as breakfast. - Sit all of the way back on the toilet keeping your back fairly straight and while sitting up, try to rest the tops of your forearms on your upper thighs.   - Raising your feet with a step stool/squatty potty can be helpful to improve the angle that allows your stool to pass through the rectum. - Relax the rectum feeling it bulge toward the toilet water .  If you feel your rectum raising toward your body, you are contracting rather than relaxing. - Breathe in and slowly exhale. Belly breath by expanding your belly towards your belly button. Keep belly expanded as you gently direct pressure down and back to the anus.  A low pitched GRRR sound can assist with increasing intra-abdominal pressure.  (Can also trying to blow on a pinwheel and make it move, this helps with the same belly breathing) - Repeat 3-4 times. If unsuccessful, contract the pelvic floor to restore normal tone and get off the toilet.  Avoid excessive straining. - To reduce excessive wiping by teaching your anus to normally contract, place hands on outer aspect of knees and resist knee movement outward.  Hold 5-10 second then place hands just inside of knees and  resist inward movement of knees.  Hold 5 seconds.  Repeat a few times each way.  Go to the ER if unable to pass gas, severe AB pain, unable to hold down food, any shortness of breath of chest pain.   Get on the pepcid  daily Avoid spicy and acidic foods Avoid fatty foods Limit your intake of coffee, tea, alcohol, and carbonated drinks Work to maintain a healthy weight Keep the head of the bed elevated at least 3 inches with blocks or a wedge pillow if you are having any nighttime symptoms Stay upright for 2 hours after eating Avoid meals and snacks three to four hours before bedtime.   Follow up in 4-6 weeks.  Thank you for trusting me with your gastrointestinal care!   Alan Coombs,  PA-C  _______________________________________________________  If your blood pressure at your visit was 140/90 or greater, please contact your primary care physician to follow up on this.  _______________________________________________________  If you are age 10 or older, your body mass index should be between 23-30. Your Body mass index is 25.65 kg/m. If this is out of the aforementioned range listed, please consider follow up with your Primary Care Provider.  If you are age 47 or younger, your body mass index should be between 19-25. Your Body mass index is 25.65 kg/m. If this is out of the aformentioned range listed, please consider follow up with your Primary Care Provider.   ________________________________________________________  The [facility] providers would like to encourage you to use MYCHART to communicate with providers for non-urgent requests or questions.  Due to long hold times on the telephone, sending your provider a message by [facility] may be a faster and more efficient way to get a response.  Please allow 48 business hours for a response.  Please remember that this is for non-urgent requests.  _______________________________________________________  [facility] is using a team-based approach to care.  Your team is made up of your doctor and two to three APPS. Our APPS (Nurse Practitioners and Physician Assistants) work with your physician to ensure care continuity for you. They are fully qualified to address your health concerns and develop a treatment plan. They communicate directly with your gastroenterologist to care for you. Seeing the Advanced Practice Practitioners on your physician's team can help you by facilitating care more promptly, often allowing for earlier appointments, access to diagnostic testing, procedures, and other specialty referrals.     Here some information about pelvic floor dysfunction. This may be contributing to some of your  symptoms. We will continue with our evaluation but I do want you to consider adding on fiber supplement with low-dose MiraLAX  daily. We could also refer to pelvic floor physical therapy.   Pelvic Floor Dysfunction, Female Pelvic floor dysfunction (PFD) is a condition that results when the group of muscles and connective tissues that support the organs in the pelvis (pelvic floor muscles) do not work well. These muscles and their connections form a sling that supports the colon and bladder. In women, they also support the uterus. PFD causes pelvic floor muscles to be too weak, too tight, or both. In PFD, muscle movements are not coordinated. This may cause bowel or bladder problems. It may also cause pain. What are the causes? This condition may be caused by an injury to the pelvic area or by a weakening of pelvic muscles. This often results from pregnancy and childbirth or other types of strain. In many cases, the exact cause is not known. What  increases the risk? The following factors may make you more likely to develop this condition: Having chronic bladder tissue inflammation (interstitial cystitis). Being an older person. Being overweight. History of radiation treatment for cancer in the pelvic region. Previous pelvic surgery, such as removal of the uterus (hysterectomy). What are the signs or symptoms? Symptoms of this condition vary and may include: Bladder symptoms, such as: Trouble starting urination and emptying the bladder. Frequent urinary tract infections. Leaking urine when coughing, laughing, or exercising (stress incontinence). Having to pass urine urgently or frequently. Pain when passing urine. Bowel symptoms, such as: Constipation. Urgent or frequent bowel movements. Incomplete bowel movements. Painful bowel movements. Leaking stool or gas. Unexplained genital or rectal pain. Genital or rectal muscle spasms. Low back pain. Other symptoms may include: A heavy,  full, or aching feeling in the vagina. A bulge that protrudes into the vagina. Pain during or after sex. How is this diagnosed? This condition may be diagnosed based on: Your symptoms and medical history. A physical exam. During the exam, your health care provider may check your pelvic muscles for tightness, spasm, pain, or weakness. This may include a rectal exam and a pelvic exam. In some cases, you may have diagnostic tests, such as: Electrical muscle function tests. Urine flow testing. X-ray tests of bowel function. Ultrasound of the pelvic organs. How is this treated? Treatment for this condition depends on the symptoms. Treatment options include: Physical therapy. This may include Kegel exercises to help relax or strengthen the pelvic floor muscles. Biofeedback. This type of therapy provides feedback on how tight your pelvic floor muscles are so that you can learn to control them. Internal or external massage therapy. A treatment that involves electrical stimulation of the pelvic floor muscles to help control pain (transcutaneous electrical nerve stimulation, or TENS). Sound wave therapy (ultrasound) to reduce muscle spasms. Medicines, such as: Muscle relaxants. Bladder control medicines. Surgery to reconstruct or support pelvic floor muscles may be an option if other treatments do not help. Follow these instructions at home: Activity Do your usual activities as told by your health care provider. Ask your health care provider if you should modify any activities. Do pelvic floor strengthening or relaxing exercises at home as told by your physical therapist. Lifestyle Maintain a healthy weight. Eat foods that are high in fiber, such as beans, whole grains, and fresh fruits and vegetables. Limit foods that are high in fat and processed sugars, such as fried or sweet foods. Manage stress with relaxation techniques such as yoga or meditation. General instructions If you have  problems with leakage: Use absorbable pads or wear padded underwear. Wash frequently with mild soap. Keep your genital and anal area as clean and dry as possible. Ask your health care provider if you should try a barrier cream to prevent skin irritation. Take warm baths to relieve pelvic muscle tension or spasms. Take over-the-counter and prescription medicines only as told by your health care provider. Keep all follow-up visits. How is this prevented? The cause of PFD is not always known, but there are a few things you can do to reduce the risk of developing this condition, including: Staying at a healthy weight. Getting regular exercise. Managing stress. Contact a health care provider if: Your symptoms are not improving with home care. You have signs or symptoms of PFD that get worse at home. You develop new signs or symptoms. You have signs of a urinary tract infection, such as: Fever. Chills. Increased urinary frequency. A burning  feeling when urinating. You have not had a bowel movement in 3 days (constipation). Summary Pelvic floor dysfunction results when the muscles and connective tissues in your pelvic floor do not work well. These muscles and their connections form a sling that supports your colon and bladder. In women, they also support the uterus. PFD may be caused by an injury to the pelvic area or by a weakening of pelvic muscles. PFD causes pelvic floor muscles to be too weak, too tight, or a combination of both. Symptoms may vary from person to person. In most cases, PFD can be treated with physical therapies and medicines. Surgery may be an option if other treatments do not help. This information is not intended to replace advice given to you by your health care provider. Make sure you discuss any questions you have with your health care provider. Document Revised: 08/31/2020 Document Reviewed: 08/31/2020 Elsevier Patient Education  2022 [employer].

## 2024-03-24 ENCOUNTER — Telehealth: Payer: Self-pay

## 2024-03-24 NOTE — Transitions of Care (Post Inpatient/ED Visit) (Signed)
   03/24/2024  Name: Krystal Swanson MRN: [id number] DOB: 1950-10-30  Today's TOC FU Call Status: Today's TOC FU Call Status:: Unsuccessful Call (1st Attempt) Unsuccessful Call (1st Attempt) Date: 03/24/24  Attempted to reach the patient regarding the most recent Inpatient/ED visit.  Follow Up Plan: Additional outreach attempts will be made to reach the patient to complete the Transitions of Care (Post Inpatient/ED visit) call.   Shona Prow RN, CCM [place]  VBCI-Population Health RN Care Manager [phone]

## 2024-03-25 ENCOUNTER — Telehealth: Payer: Self-pay

## 2024-03-25 NOTE — Transitions of Care (Post Inpatient/ED Visit) (Signed)
   03/25/2024  Name: Krystal Swanson MRN: [id number] DOB: Jul 26, 1950  Today's TOC FU Call Status: Today's TOC FU Call Status:: Unsuccessful Call (2nd Attempt) Unsuccessful Call (2nd Attempt) Date: 03/25/24  Attempted to reach the patient regarding the most recent Inpatient/ED visit.  Follow Up Plan: Additional outreach attempts will be made to reach the patient to complete the Transitions of Care (Post Inpatient/ED visit) call.   Shona Prow RN, CCM [place]  VBCI-Population Health RN Care Manager [phone]

## 2024-03-26 ENCOUNTER — Telehealth: Payer: Self-pay

## 2024-03-26 NOTE — Transitions of Care (Post Inpatient/ED Visit) (Signed)
   03/26/2024  Name: Krystal Swanson MRN: [id number] DOB: [birth year]  Today's TOC FU Call Status: Today's TOC FU Call Status:: Successful TOC FU Call Completed TOC FU Call Complete Date: 03/26/24 (Patient states she spoke with her endocrinologist and the only question she had was regarding her hydrocortisone  and that question has been answered and she states she will call if any questions arise but does not feel she needs the [facility] program)  Patient's Name and Date of Birth confirmed. DOB, Name  Shona Prow RN, CCM [place]  VBCI-Population Health RN Care Manager [phone]

## 2024-05-08 ENCOUNTER — Telehealth: Payer: Self-pay | Admitting: Physician Assistant

## 2024-05-08 NOTE — Progress Notes (Deleted)
 "    05/08/2024 Dalexa Gentz [id number] [birth year]  Referring provider: Ina Marcellus RAMAN, MD Primary GI doctor: Dr. Charlanne  ASSESSMENT AND PLAN:   GERD Normal LFTs, CBC 10/25 2019 EGD mild gastritis otherwise normal - on pepcid  as needed Likely worsening due to opoids/constipation, some dysphagia/oropharangeal dysphagia, declines evaluation - optimize bowel habits first -get on pepcid  daily -Lifestyle changes discussed, avoid NSAIDS, ETOH, hand out given to the patient - consider RUQ US  and HIDA pending results - consider EGD/barium swallow if symptoms are not better at follow up 1 month  Constipation Worsening since fall in July with opoid use, stopped opoids last week, now on lyrica, on vesicare for urinary incontinence Had Xray with PCP that showed constipation Can be 5 days before BM, with bloating, upper GERD and nausea.  - Increase fiber/ water  intake, decrease caffeine, increase activity level. - do bowel prep 2 day, enemas, add miralax  2 tablespoons, consider linzess 145 mcg if miralax  not helping  Diverticulosis Will call if any symptoms. Add on fiber supplement, avoid NSAIDS, information given  History of AVM proximal ascending colon No hematochezia   History of colonic polyps 09/2017 Negative cologuard March 2025  Addison's disease On Cortef  x 1989  Acute left ischemic CVA Admission 03/21/2024 for dysarthria aphasia mild right facial droop status post thrombolysis' Being worked up for adrenoleukodystrophy  03/22/2024 TTE ejection fraction greater than 70% unremarkable valves  Patient Care Team: Fernand Tracey LABOR, MD as PCP - General (Family Medicine)  HISTORY OF PRESENT ILLNESS: 74 y.o. female with a past medical history listed below presents for evaluation of constipation.   I last saw the patient in the office 03/20/2024, patient had subsequent admission for stroke 03/21/2024 currently on low-dose aspirin  and Crestor   Discussed the use of AI scribe  software for clinical note transcription with the patient, who gave verbal consent to proceed.  History of Present Illness            She  reports that she has quit smoking. Her smoking use included cigarettes. She has a 15 pack-year smoking history. She has never used smokeless tobacco. She reports current alcohol use. She reports that she does not use drugs.  RELEVANT GI HISTORY, IMAGING AND LABS: Results          CBC    Component Value Date/Time   WBC 12.7 (H) 07/03/2022 0310   RBC 4.05 07/03/2022 0310   HGB 13.7 07/03/2022 0310   HCT 40.7 07/03/2022 0310   PLT 209 07/03/2022 0310   MCV 100.5 (H) 07/03/2022 0310   MCH 33.8 07/03/2022 0310   MCHC 33.7 07/03/2022 0310   RDW 12.7 07/03/2022 0310   No results for input(s): HGB in the last 8760 hours.  CMP     Component Value Date/Time   NA 139 07/03/2022 0310   K 3.3 (L) 07/03/2022 0310   CL 106 07/03/2022 0310   CO2 28 07/03/2022 0310   GLUCOSE 151 (H) 07/03/2022 0310   BUN 17 07/03/2022 0310   CREATININE 0.84 07/03/2022 0310   CALCIUM  8.2 (L) 07/03/2022 0310   PROT 6.7 09/16/2018 1540   ALBUMIN 4.2 09/16/2018 1540   AST 19 09/16/2018 1540   ALT 34 09/16/2018 1540   ALKPHOS 45 09/16/2018 1540   BILITOT 0.7 09/16/2018 1540   GFRNONAA >60 07/03/2022 0310   GFRAA >60 09/23/2018 1550      Latest Ref Rng & Units 09/16/2018    3:40 PM  Hepatic Function  Total Protein 6.5 -  8.1 g/dL 6.7   Albumin 3.5 - 5.0 g/dL 4.2   AST 15 - 41 U/L 19   ALT 0 - 44 U/L 34   Alk Phosphatase 38 - 126 U/L 45   Total Bilirubin 0.3 - 1.2 mg/dL 0.7       Current Medications:   Current Outpatient Medications (Endocrine & Metabolic):    hydrocortisone  (CORTEF ) 10 MG tablet, Take 15-20 mg by mouth See admin instructions. Take 20 mg by mouth in the morning and 15 mg in the afternoon   levothyroxine  (SYNTHROID , LEVOTHROID) 88 MCG tablet, Take 88 mcg by mouth daily before breakfast.   Current Outpatient Medications (Cardiovascular):     rosuvastatin  (CRESTOR ) 10 MG tablet, Take 10 mg by mouth every evening.  Current Outpatient Medications (Respiratory):    loratadine  (CLARITIN ) 10 MG tablet, Take 10 mg by mouth daily.  Current Outpatient Medications (Analgesics):    oxyCODONE  (OXY IR/ROXICODONE ) 5 MG immediate release tablet, Take 1-2 tablets (5-10 mg total) by mouth every 6 (six) hours as needed for severe pain.  Current Outpatient Medications (Hematological):    cyanocobalamin (VITAMIN B12) 1000 MCG tablet, Take 1,000 mcg by mouth daily.  Current Outpatient Medications (Other):    Ascorbic Acid (VITAMIN C) 1000 MG tablet, Take 1,000 mg by mouth daily.   CALCIUM  MAGNESIUM ZINC PO, Take 1 tablet by mouth daily.   Cholecalciferol (VITAMIN D) 125 MCG (5000 UT) CAPS, Take 5,000 Units by mouth daily.   famotidine  (PEPCID ) 20 MG tablet, Take 20 mg by mouth daily as needed for heartburn or indigestion.   Omega-3 Fatty Acids (FISH OIL) 1000 MG CAPS, Take 1,000 mg by mouth 3 (three) times a week.   OVER THE COUNTER MEDICATION, Take 1 capsule by mouth every evening. Menopause Transitions   OVER THE COUNTER MEDICATION, Vitamin d 3 5000 units daily   polyethylene glycol-electrolytes (NULYTELY ) 420 g solution, do 6 oz every 30 mins until the impaction passes, can do 1/2 gallon on first day and 1/2 gallon on 2nd day.  Can continue 2 fleets enemas a day until it resolves.   solifenacin (VESICARE) 5 MG tablet, Take 5 mg by mouth daily as needed (overactive bladder).   venlafaxine  XR (EFFEXOR -XR) 75 MG 24 hr capsule, Take 75 mg by mouth daily.   vitamin E 180 MG (400 UNITS) capsule, Take 400 Units by mouth daily.  Medical History:  Past Medical History:  Diagnosis Date   Addison's disease (HCC)    Anxiety    Arthritis    Bilateral cataracts    Depression    Diverticulosis 2019   Noted on colonoscopy   Dyspnea    with exertion   GERD (gastroesophageal reflux disease)    History of colon polyps    Hyperlipidemia     Hypothyroidism    Osteoporosis    Pneumonia    Pre-diabetes    Seasonal allergies    Tear of left biceps muscle    Vitamin D deficiency    Allergies: No Known Allergies   Surgical History:  She  has a past surgical history that includes Colonoscopy (2014); colon polyps (2009); Upper gi endoscopy; Total hip arthroplasty (Right, 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 Family History:  Her family history includes Breast cancer in her maternal aunt; Colon cancer in her maternal aunt; Heart attack in her father; Peptic Ulcer in her father and mother; Stroke in her father.  REVIEW OF SYSTEMS  : All other systems reviewed and negative except where noted in  the History of Present Illness.  PHYSICAL EXAM: There were no vitals taken for this visit. Physical Exam          Alan JONELLE Coombs, PA-C 8:58 AM   "

## 2024-05-08 NOTE — Telephone Encounter (Signed)
 Inbound call from patient requesting a call back to discuss the appointment that she has for 1/5 at 9:20 with Alan Coombs. I advised her that she is scheduled for a follow up for GERD and diverticulosis. She is requesting to know what the appointment will be about. Please advise.

## 2024-05-08 NOTE — Telephone Encounter (Signed)
 Pt calling in regards to upcoming appointment. Pt stated that she had done a bowel purge after her recent office visit and now taking Miralx daily and having regular BM daily. No complaints of abdominal pain or reflux.  OV was canceled. Pt made aware.  Pt verbalized understanding with all questions answered.

## 2024-05-11 ENCOUNTER — Ambulatory Visit: Admitting: Physician Assistant
